# Patient Record
Sex: Female | Born: 1945
Health system: Southern US, Community
[De-identification: ages and names within clinical notes are randomized; demographics above are authoritative.]

## PROBLEM LIST (undated history)

## (undated) DIAGNOSIS — E785 Hyperlipidemia, unspecified: Secondary | ICD-10-CM

## (undated) DIAGNOSIS — C801 Malignant (primary) neoplasm, unspecified: Secondary | ICD-10-CM

## (undated) DIAGNOSIS — J189 Pneumonia, unspecified organism: Secondary | ICD-10-CM

## (undated) DIAGNOSIS — Z87442 Personal history of urinary calculi: Secondary | ICD-10-CM

## (undated) DIAGNOSIS — M1990 Unspecified osteoarthritis, unspecified site: Secondary | ICD-10-CM

## (undated) HISTORY — PX: EYE SURGERY: SHX253

## (undated) HISTORY — PX: TONSILLECTOMY: SUR1361

## (undated) HISTORY — PX: COLONOSCOPY: SHX174

## (undated) HISTORY — PX: BREAST SURGERY: SHX581

---

## 2014-03-22 DIAGNOSIS — D0512 Intraductal carcinoma in situ of left breast: Secondary | ICD-10-CM | POA: Insufficient documentation

## 2015-07-13 DIAGNOSIS — Z79810 Long term (current) use of selective estrogen receptor modulators (SERMs): Secondary | ICD-10-CM

## 2015-07-13 DIAGNOSIS — D0512 Intraductal carcinoma in situ of left breast: Secondary | ICD-10-CM

## 2015-07-13 DIAGNOSIS — Z170 Estrogen receptor positive status [ER+]: Secondary | ICD-10-CM

## 2015-07-13 DIAGNOSIS — M8580 Other specified disorders of bone density and structure, unspecified site: Secondary | ICD-10-CM

## 2015-09-19 DIAGNOSIS — R509 Fever, unspecified: Secondary | ICD-10-CM | POA: Diagnosis not present

## 2015-09-19 DIAGNOSIS — Z20828 Contact with and (suspected) exposure to other viral communicable diseases: Secondary | ICD-10-CM | POA: Diagnosis not present

## 2015-09-19 DIAGNOSIS — R0989 Other specified symptoms and signs involving the circulatory and respiratory systems: Secondary | ICD-10-CM | POA: Diagnosis not present

## 2015-09-19 DIAGNOSIS — Z6830 Body mass index (BMI) 30.0-30.9, adult: Secondary | ICD-10-CM | POA: Diagnosis not present

## 2015-11-08 DIAGNOSIS — Z79811 Long term (current) use of aromatase inhibitors: Secondary | ICD-10-CM | POA: Diagnosis not present

## 2015-11-08 DIAGNOSIS — Z86000 Personal history of in-situ neoplasm of breast: Secondary | ICD-10-CM | POA: Diagnosis not present

## 2015-12-06 DIAGNOSIS — E559 Vitamin D deficiency, unspecified: Secondary | ICD-10-CM | POA: Diagnosis not present

## 2015-12-06 DIAGNOSIS — D0592 Unspecified type of carcinoma in situ of left breast: Secondary | ICD-10-CM | POA: Diagnosis not present

## 2015-12-06 DIAGNOSIS — E785 Hyperlipidemia, unspecified: Secondary | ICD-10-CM | POA: Diagnosis not present

## 2015-12-06 DIAGNOSIS — E669 Obesity, unspecified: Secondary | ICD-10-CM | POA: Diagnosis not present

## 2015-12-06 DIAGNOSIS — Z6830 Body mass index (BMI) 30.0-30.9, adult: Secondary | ICD-10-CM | POA: Diagnosis not present

## 2015-12-06 DIAGNOSIS — M8580 Other specified disorders of bone density and structure, unspecified site: Secondary | ICD-10-CM | POA: Diagnosis not present

## 2015-12-06 DIAGNOSIS — Z23 Encounter for immunization: Secondary | ICD-10-CM | POA: Diagnosis not present

## 2016-02-27 DIAGNOSIS — Z13820 Encounter for screening for osteoporosis: Secondary | ICD-10-CM | POA: Diagnosis not present

## 2016-02-27 DIAGNOSIS — M8589 Other specified disorders of bone density and structure, multiple sites: Secondary | ICD-10-CM | POA: Diagnosis not present

## 2016-03-06 DIAGNOSIS — Z9889 Other specified postprocedural states: Secondary | ICD-10-CM | POA: Diagnosis not present

## 2016-03-06 DIAGNOSIS — D0512 Intraductal carcinoma in situ of left breast: Secondary | ICD-10-CM | POA: Diagnosis not present

## 2016-03-06 DIAGNOSIS — R928 Other abnormal and inconclusive findings on diagnostic imaging of breast: Secondary | ICD-10-CM | POA: Diagnosis not present

## 2016-03-14 DIAGNOSIS — D0512 Intraductal carcinoma in situ of left breast: Secondary | ICD-10-CM | POA: Diagnosis not present

## 2016-03-14 DIAGNOSIS — Z853 Personal history of malignant neoplasm of breast: Secondary | ICD-10-CM | POA: Diagnosis not present

## 2016-06-11 DIAGNOSIS — D0592 Unspecified type of carcinoma in situ of left breast: Secondary | ICD-10-CM | POA: Diagnosis not present

## 2016-06-11 DIAGNOSIS — Z9181 History of falling: Secondary | ICD-10-CM | POA: Diagnosis not present

## 2016-06-11 DIAGNOSIS — Z1389 Encounter for screening for other disorder: Secondary | ICD-10-CM | POA: Diagnosis not present

## 2016-06-11 DIAGNOSIS — Z23 Encounter for immunization: Secondary | ICD-10-CM | POA: Diagnosis not present

## 2016-06-11 DIAGNOSIS — E785 Hyperlipidemia, unspecified: Secondary | ICD-10-CM | POA: Diagnosis not present

## 2016-06-11 DIAGNOSIS — M8589 Other specified disorders of bone density and structure, multiple sites: Secondary | ICD-10-CM | POA: Diagnosis not present

## 2016-06-11 DIAGNOSIS — E559 Vitamin D deficiency, unspecified: Secondary | ICD-10-CM | POA: Diagnosis not present

## 2016-09-12 DIAGNOSIS — Z79810 Long term (current) use of selective estrogen receptor modulators (SERMs): Secondary | ICD-10-CM | POA: Diagnosis not present

## 2016-09-12 DIAGNOSIS — Z86000 Personal history of in-situ neoplasm of breast: Secondary | ICD-10-CM | POA: Diagnosis not present

## 2016-12-16 DIAGNOSIS — M8589 Other specified disorders of bone density and structure, multiple sites: Secondary | ICD-10-CM | POA: Diagnosis not present

## 2016-12-16 DIAGNOSIS — E559 Vitamin D deficiency, unspecified: Secondary | ICD-10-CM | POA: Diagnosis not present

## 2016-12-16 DIAGNOSIS — D0592 Unspecified type of carcinoma in situ of left breast: Secondary | ICD-10-CM | POA: Diagnosis not present

## 2016-12-16 DIAGNOSIS — E785 Hyperlipidemia, unspecified: Secondary | ICD-10-CM | POA: Diagnosis not present

## 2017-01-19 DIAGNOSIS — M25551 Pain in right hip: Secondary | ICD-10-CM | POA: Diagnosis not present

## 2017-01-19 DIAGNOSIS — M25562 Pain in left knee: Secondary | ICD-10-CM | POA: Diagnosis not present

## 2017-03-09 DIAGNOSIS — R928 Other abnormal and inconclusive findings on diagnostic imaging of breast: Secondary | ICD-10-CM | POA: Diagnosis not present

## 2017-03-12 DIAGNOSIS — Z170 Estrogen receptor positive status [ER+]: Secondary | ICD-10-CM | POA: Diagnosis not present

## 2017-03-12 DIAGNOSIS — D0512 Intraductal carcinoma in situ of left breast: Secondary | ICD-10-CM | POA: Diagnosis not present

## 2017-03-12 DIAGNOSIS — Z79810 Long term (current) use of selective estrogen receptor modulators (SERMs): Secondary | ICD-10-CM | POA: Diagnosis not present

## 2017-05-22 DIAGNOSIS — Z23 Encounter for immunization: Secondary | ICD-10-CM | POA: Diagnosis not present

## 2017-06-19 DIAGNOSIS — M8589 Other specified disorders of bone density and structure, multiple sites: Secondary | ICD-10-CM | POA: Diagnosis not present

## 2017-06-19 DIAGNOSIS — D0592 Unspecified type of carcinoma in situ of left breast: Secondary | ICD-10-CM | POA: Diagnosis not present

## 2017-06-19 DIAGNOSIS — E785 Hyperlipidemia, unspecified: Secondary | ICD-10-CM | POA: Diagnosis not present

## 2017-06-19 DIAGNOSIS — E559 Vitamin D deficiency, unspecified: Secondary | ICD-10-CM | POA: Diagnosis not present

## 2017-09-10 DIAGNOSIS — Z86000 Personal history of in-situ neoplasm of breast: Secondary | ICD-10-CM | POA: Diagnosis not present

## 2017-09-10 DIAGNOSIS — Z79810 Long term (current) use of selective estrogen receptor modulators (SERMs): Secondary | ICD-10-CM | POA: Diagnosis not present

## 2017-12-25 DIAGNOSIS — M8589 Other specified disorders of bone density and structure, multiple sites: Secondary | ICD-10-CM | POA: Diagnosis not present

## 2017-12-25 DIAGNOSIS — E559 Vitamin D deficiency, unspecified: Secondary | ICD-10-CM | POA: Diagnosis not present

## 2017-12-25 DIAGNOSIS — E785 Hyperlipidemia, unspecified: Secondary | ICD-10-CM | POA: Diagnosis not present

## 2017-12-25 DIAGNOSIS — D0592 Unspecified type of carcinoma in situ of left breast: Secondary | ICD-10-CM | POA: Diagnosis not present

## 2018-03-10 DIAGNOSIS — Z853 Personal history of malignant neoplasm of breast: Secondary | ICD-10-CM | POA: Diagnosis not present

## 2018-03-10 DIAGNOSIS — R928 Other abnormal and inconclusive findings on diagnostic imaging of breast: Secondary | ICD-10-CM | POA: Diagnosis not present

## 2018-03-12 DIAGNOSIS — D0512 Intraductal carcinoma in situ of left breast: Secondary | ICD-10-CM | POA: Diagnosis not present

## 2018-03-12 DIAGNOSIS — Z170 Estrogen receptor positive status [ER+]: Secondary | ICD-10-CM | POA: Diagnosis not present

## 2018-03-12 DIAGNOSIS — Z79810 Long term (current) use of selective estrogen receptor modulators (SERMs): Secondary | ICD-10-CM | POA: Diagnosis not present

## 2018-03-16 DIAGNOSIS — M1712 Unilateral primary osteoarthritis, left knee: Secondary | ICD-10-CM | POA: Diagnosis not present

## 2018-03-18 DIAGNOSIS — H25811 Combined forms of age-related cataract, right eye: Secondary | ICD-10-CM | POA: Diagnosis not present

## 2018-03-30 DIAGNOSIS — G8929 Other chronic pain: Secondary | ICD-10-CM | POA: Diagnosis not present

## 2018-03-30 DIAGNOSIS — M25562 Pain in left knee: Secondary | ICD-10-CM | POA: Diagnosis not present

## 2018-03-30 DIAGNOSIS — M1712 Unilateral primary osteoarthritis, left knee: Secondary | ICD-10-CM | POA: Diagnosis not present

## 2018-03-31 DIAGNOSIS — H25812 Combined forms of age-related cataract, left eye: Secondary | ICD-10-CM | POA: Diagnosis not present

## 2018-04-27 DIAGNOSIS — Z23 Encounter for immunization: Secondary | ICD-10-CM | POA: Diagnosis not present

## 2018-04-29 DIAGNOSIS — M25562 Pain in left knee: Secondary | ICD-10-CM | POA: Diagnosis not present

## 2018-04-29 DIAGNOSIS — G8929 Other chronic pain: Secondary | ICD-10-CM | POA: Diagnosis not present

## 2018-06-29 DIAGNOSIS — E785 Hyperlipidemia, unspecified: Secondary | ICD-10-CM | POA: Diagnosis not present

## 2018-06-29 DIAGNOSIS — D0592 Unspecified type of carcinoma in situ of left breast: Secondary | ICD-10-CM | POA: Diagnosis not present

## 2018-06-29 DIAGNOSIS — M8589 Other specified disorders of bone density and structure, multiple sites: Secondary | ICD-10-CM | POA: Diagnosis not present

## 2018-06-29 DIAGNOSIS — E559 Vitamin D deficiency, unspecified: Secondary | ICD-10-CM | POA: Diagnosis not present

## 2018-07-20 DIAGNOSIS — G8929 Other chronic pain: Secondary | ICD-10-CM | POA: Diagnosis not present

## 2018-07-20 DIAGNOSIS — M1712 Unilateral primary osteoarthritis, left knee: Secondary | ICD-10-CM | POA: Diagnosis not present

## 2018-09-13 ENCOUNTER — Other Ambulatory Visit: Payer: Self-pay | Admitting: Orthopedic Surgery

## 2018-09-27 NOTE — Patient Instructions (Signed)
Brynnlie Unterreiner Beers  09/27/2018   Your procedure is scheduled on: 10-04-18   Report to [facility] Main  Entrance               Report to  Short stay at       0530 AM    Call this number if you have problems the morning of surgery [phone]    Remember: Do not eat food or drink liquids :After Midnight.  BRUSH YOUR TEETH MORNING OF SURGERY AND RINSE YOUR MOUTH OUT, NO CHEWING GUM CANDY OR MINTS.     Take these medicines the morning of surgery with A SIP OF WATER: none                                You may not have any metal on your body including hair pins and              piercings  Do not wear jewelry, make-up, lotions, powders or perfumes, deodorant             Do not wear nail polish.  Do not shave  48 hours prior to surgery.     Do not bring valuables to the hospital. [place].  Contacts, dentures or bridgework may not be worn into surgery.  Leave suitcase in the car. After surgery it may be brought to your room.                 Please read over the following fact sheets you were given: _____________________________________________________________________            [facility] - Preparing for Surgery Before surgery, you can play an important role.  Because skin is not sterile, your skin needs to be as free of germs as possible.  You can reduce the number of germs on your skin by washing with CHG (chlorahexidine gluconate) soap before surgery.  CHG is an antiseptic cleaner which kills germs and bonds with the skin to continue killing germs even after washing. Please DO NOT use if you have an allergy to CHG or antibacterial soaps.  If your skin becomes reddened/irritated stop using the CHG and inform your nurse when you arrive at Short Stay. Do not shave (including legs and underarms) for at least 48 hours prior to the first CHG shower.  You may shave your face/neck. Please follow these  instructions carefully:  1.  Shower with CHG Soap the night before surgery and the  morning of Surgery.  2.  If you choose to wash your hair, wash your hair first as usual with your  normal  shampoo.  3.  After you shampoo, rinse your hair and body thoroughly to remove the  shampoo.                           4.  Use CHG as you would any other liquid soap.  You can apply chg directly  to the skin and wash                       Gently with a scrungie or clean washcloth.  5.  Apply the CHG Soap to your body  ONLY FROM THE NECK DOWN.   Do not use on face/ open                           Wound or open sores. Avoid contact with eyes, ears mouth and genitals (private parts).                       Wash face,  Genitals (private parts) with your normal soap.             6.  Wash thoroughly, paying special attention to the area where your surgery  will be performed.  7.  Thoroughly rinse your body with warm water from the neck down.  8.  DO NOT shower/wash with your normal soap after using and rinsing off  the CHG Soap.                9.  Pat yourself dry with a clean towel.            10.  Wear clean pajamas.            11.  Place clean sheets on your bed the night of your first shower and do not  sleep with pets. Day of Surgery : Do not apply any lotions/deodorants the morning of surgery.  Please wear clean clothes to the hospital/surgery center.  FAILURE TO FOLLOW THESE INSTRUCTIONS MAY RESULT IN THE CANCELLATION OF YOUR SURGERY PATIENT SIGNATURE_________________________________  NURSE SIGNATURE__________________________________  ________________________________________________________________________   Adam Phenix  An incentive spirometer is a tool that can help keep your lungs clear and active. This tool measures how well you are filling your lungs with each breath. Taking long deep breaths may help reverse or decrease the chance of developing breathing (pulmonary) problems (especially  infection) following:  A long period of time when you are unable to move or be active. BEFORE THE PROCEDURE   If the spirometer includes an indicator to show your best effort, your nurse or respiratory therapist will set it to a desired goal.  If possible, sit up straight or lean slightly forward. Try not to slouch.  Hold the incentive spirometer in an upright position. INSTRUCTIONS FOR USE  1. Sit on the edge of your bed if possible, or sit up as far as you can in bed or on a chair. 2. Hold the incentive spirometer in an upright position. 3. Breathe out normally. 4. Place the mouthpiece in your mouth and seal your lips tightly around it. 5. Breathe in slowly and as deeply as possible, raising the piston or the ball toward the top of the column. 6. Hold your breath for 3-5 seconds or for as long as possible. Allow the piston or ball to fall to the bottom of the column. 7. Remove the mouthpiece from your mouth and breathe out normally. 8. Rest for a few seconds and repeat Steps 1 through 7 at least 10 times every 1-2 hours when you are awake. Take your time and take a few normal breaths between deep breaths. 9. The spirometer may include an indicator to show your best effort. Use the indicator as a goal to work toward during each repetition. 10. After each set of 10 deep breaths, practice coughing to be sure your lungs are clear. If you have an incision (the cut made at the time of surgery), support your incision when coughing by placing a pillow or rolled up towels firmly against it. Once  you are able to get out of bed, walk around indoors and cough well. You may stop using the incentive spirometer when instructed by your caregiver.  RISKS AND COMPLICATIONS  Take your time so you do not get dizzy or light-headed.  If you are in pain, you may need to take or ask for pain medication before doing incentive spirometry. It is harder to take a deep breath if you are having pain. AFTER  USE  Rest and breathe slowly and easily.  It can be helpful to keep track of a log of your progress. Your caregiver can provide you with a simple table to help with this. If you are using the spirometer at home, follow these instructions: [place] IF:   You are having difficultly using the spirometer.  You have trouble using the spirometer as often as instructed.  Your pain medication is not giving enough relief while using the spirometer.  You develop fever of 100.5 F (38.1 C) or higher. SEEK IMMEDIATE MEDICAL CARE IF:   You cough up bloody sputum that had not been present before.  You develop fever of 102 F (38.9 C) or greater.  You develop worsening pain at or near the incision site. MAKE SURE YOU:   Understand these instructions.  Will watch your condition.  Will get help right away if you are not doing well or get worse. Document Released: 12/01/2006 Document Revised: 10/13/2011 Document Reviewed: 02/01/2007 [facility] Patient Information 2014 [place], [place].   ________________________________________________________________________

## 2018-09-28 ENCOUNTER — Encounter (HOSPITAL_COMMUNITY)
Admission: RE | Admit: 2018-09-28 | Discharge: 2018-09-28 | Disposition: A | Discharge status: Home / Self Care | Payer: Medicare Other | Source: Ambulatory Visit | Attending: Orthopedic Surgery | Admitting: Orthopedic Surgery

## 2018-09-28 ENCOUNTER — Other Ambulatory Visit: Payer: Self-pay

## 2018-09-28 ENCOUNTER — Encounter (HOSPITAL_COMMUNITY): Payer: Self-pay

## 2018-09-28 DIAGNOSIS — Z01812 Encounter for preprocedural laboratory examination: Secondary | ICD-10-CM | POA: Insufficient documentation

## 2018-09-28 DIAGNOSIS — M1712 Unilateral primary osteoarthritis, left knee: Secondary | ICD-10-CM | POA: Insufficient documentation

## 2018-09-28 HISTORY — DX: Unspecified osteoarthritis, unspecified site: M19.90

## 2018-09-28 HISTORY — DX: Malignant (primary) neoplasm, unspecified: C80.1

## 2018-09-28 HISTORY — DX: Personal history of urinary calculi: Z87.442

## 2018-09-28 HISTORY — DX: Pneumonia, unspecified organism: J18.9

## 2018-09-28 LAB — COMPREHENSIVE METABOLIC PANEL
ALT: 17 U/L (ref 0–44)
AST: 17 U/L (ref 15–41)
Albumin: 4.4 g/dL (ref 3.5–5.0)
Alkaline Phosphatase: 44 U/L (ref 38–126)
Anion gap: 6 (ref 5–15)
BUN: 26 mg/dL — AB (ref 8–23)
CO2: 28 mmol/L (ref 22–32)
Calcium: 9 mg/dL (ref 8.9–10.3)
Chloride: 106 mmol/L (ref 98–111)
Creatinine, Ser: 0.78 mg/dL (ref 0.44–1.00)
GFR calc Af Amer: >60 mL/min (ref >60)
GFR calc non Af Amer: >60 mL/min (ref >60)
Glucose, Bld: 95 mg/dL (ref 70–99)
Potassium: 4.2 mmol/L (ref 3.5–5.1)
Sodium: 140 mmol/L (ref 135–145)
Total Bilirubin: 0.8 mg/dL (ref 0.3–1.2)
Total Protein: 7.6 g/dL (ref 6.5–8.1)

## 2018-09-28 LAB — CBC WITH DIFFERENTIAL/PLATELET
ABS IMMATURE GRANULOCYTES: 0.03 10*3/uL (ref 0.00–0.07)
Basophils Absolute: 0.1 10*3/uL (ref 0.0–0.1)
Basophils Relative: 1 %
Eosinophils Absolute: 0.2 10*3/uL (ref 0.0–0.5)
Eosinophils Relative: 2 %
HCT: 46.1 % — ABNORMAL HIGH (ref 36.0–46.0)
Hemoglobin: 14.5 g/dL (ref 12.0–15.0)
IMMATURE GRANULOCYTES: 0 %
LYMPHS PCT: 26 %
Lymphs Abs: 2.2 10*3/uL (ref 0.7–4.0)
MCH: 30.2 pg (ref 26.0–34.0)
MCHC: 31.5 g/dL (ref 30.0–36.0)
MCV: 96 fL (ref 80.0–100.0)
Monocytes Absolute: 1 10*3/uL (ref 0.1–1.0)
Monocytes Relative: 12 %
Neutro Abs: 5.1 10*3/uL (ref 1.7–7.7)
Neutrophils Relative %: 59 %
Platelets: 227 10*3/uL (ref 150–400)
RBC: 4.8 MIL/uL (ref 3.87–5.11)
RDW: 13 % (ref 11.5–15.5)
WBC: 8.6 10*3/uL (ref 4.0–10.5)
nRBC: 0 % (ref 0.0–0.2)

## 2018-09-28 LAB — SURGICAL PCR SCREEN
MRSA, PCR: NEGATIVE
Staphylococcus aureus: NEGATIVE

## 2018-09-28 NOTE — Progress Notes (Signed)
GRM:BOBOFPU Tracy Luna  CARDIOLOGIST:none  INFO IN Epic: [id number]  INFO ON CHART: hx left breast Ca stage zero no chemo or radiation , arthritis  BLOOD THINNERS AND LAST DOSES:N/A ____________________________________  PATIENT SYMPTOMS AT TIME OF PREOP: none

## 2018-10-02 NOTE — Anesthesia Preprocedure Evaluation (Addendum)
Anesthesia Evaluation  Patient identified by MRN, date of birth, ID band  Reviewed: Allergy & Precautions, NPO status , Patient's Chart, lab work & pertinent test results  Airway Mallampati: II  TM Distance: >3 FB Neck ROM: Full    Dental no notable dental hx. (+) Teeth Intact   Pulmonary Current Smoker,    Pulmonary exam normal breath sounds clear to auscultation       Cardiovascular Exercise Tolerance: Good negative cardio ROS Normal cardiovascular exam Rhythm:Regular Rate:Normal     Neuro/Psych negative neurological ROS  negative psych ROS   GI/Hepatic negative GI ROS,   Endo/Other    Renal/GU      Musculoskeletal  (+) Arthritis ,   Abdominal (+) + obese,   Peds  Hematology Hgb 14.5 Plt 227   Anesthesia Other Findings Hx of Breast Ca  Reproductive/Obstetrics                           Anesthesia Physical Anesthesia Plan  ASA: II  Anesthesia Plan: Spinal   Post-op Pain Management:  Regional for Post-op pain   Induction:   PONV Risk Score and Plan: Treatment may vary due to age or medical condition, Ondansetron and Dexamethasone  Airway Management Planned: Nasal Cannula and Natural Airway  Additional Equipment:   Intra-op Plan:   Post-operative Plan:   Informed Consent:   Plan Discussed with:   Anesthesia Plan Comments: (&!F fot L TKA Under Spinal w L Adductor canal block)        Anesthesia Quick Evaluation

## 2018-10-03 MED ORDER — BUPIVACAINE LIPOSOME 1.3 % IJ SUSP
20.0000 mL | Freq: Once | INTRAMUSCULAR | Status: DC
  Filled 2018-10-03: qty 20

## 2018-10-04 ENCOUNTER — Ambulatory Visit (HOSPITAL_COMMUNITY): Payer: Medicare Other | Admitting: Physician Assistant

## 2018-10-04 ENCOUNTER — Other Ambulatory Visit: Payer: Self-pay

## 2018-10-04 ENCOUNTER — Encounter (HOSPITAL_COMMUNITY)
Admission: RE | Disposition: A | Discharge status: Home / Self Care | Payer: Self-pay | Source: Home / Self Care | Attending: Orthopedic Surgery

## 2018-10-04 ENCOUNTER — Encounter (HOSPITAL_COMMUNITY): Payer: Self-pay | Admitting: Emergency Medicine

## 2018-10-04 ENCOUNTER — Observation Stay (HOSPITAL_COMMUNITY)
Admission: RE | Admit: 2018-10-04 | Discharge: 2018-10-05 | Disposition: A | Discharge status: Home / Self Care | Payer: Medicare Other | Attending: Orthopedic Surgery | Admitting: Orthopedic Surgery

## 2018-10-04 ENCOUNTER — Ambulatory Visit (HOSPITAL_COMMUNITY): Payer: Medicare Other | Admitting: Certified Registered Nurse Anesthetist

## 2018-10-04 DIAGNOSIS — Z6833 Body mass index (BMI) 33.0-33.9, adult: Secondary | ICD-10-CM | POA: Diagnosis not present

## 2018-10-04 DIAGNOSIS — Z96659 Presence of unspecified artificial knee joint: Secondary | ICD-10-CM

## 2018-10-04 DIAGNOSIS — Z79899 Other long term (current) drug therapy: Secondary | ICD-10-CM | POA: Insufficient documentation

## 2018-10-04 DIAGNOSIS — R262 Difficulty in walking, not elsewhere classified: Secondary | ICD-10-CM | POA: Diagnosis not present

## 2018-10-04 DIAGNOSIS — F17210 Nicotine dependence, cigarettes, uncomplicated: Secondary | ICD-10-CM | POA: Diagnosis not present

## 2018-10-04 DIAGNOSIS — Z853 Personal history of malignant neoplasm of breast: Secondary | ICD-10-CM | POA: Insufficient documentation

## 2018-10-04 DIAGNOSIS — Z791 Long term (current) use of non-steroidal anti-inflammatories (NSAID): Secondary | ICD-10-CM | POA: Diagnosis not present

## 2018-10-04 DIAGNOSIS — E669 Obesity, unspecified: Secondary | ICD-10-CM | POA: Diagnosis not present

## 2018-10-04 DIAGNOSIS — M1712 Unilateral primary osteoarthritis, left knee: Principal | ICD-10-CM | POA: Insufficient documentation

## 2018-10-04 DIAGNOSIS — G8918 Other acute postprocedural pain: Secondary | ICD-10-CM | POA: Diagnosis not present

## 2018-10-04 HISTORY — PX: TOTAL KNEE ARTHROPLASTY: SHX125

## 2018-10-04 SURGERY — ARTHROPLASTY, KNEE, TOTAL
Anesthesia: Spinal | Site: Knee | Laterality: Left

## 2018-10-04 MED ORDER — SODIUM CHLORIDE 0.9 % IR SOLN
Status: DC | PRN
  Administered 2018-10-04: 3000 mL

## 2018-10-04 MED ORDER — FENTANYL CITRATE (PF) 100 MCG/2ML IJ SOLN
INTRAMUSCULAR | Status: AC
  Filled 2018-10-04: qty 2

## 2018-10-04 MED ORDER — SODIUM CHLORIDE (PF) 0.9 % IJ SOLN
INTRAMUSCULAR | Status: AC
  Filled 2018-10-04: qty 20

## 2018-10-04 MED ORDER — STERILE WATER FOR IRRIGATION IR SOLN
Status: DC | PRN
  Administered 2018-10-04: 2000 mL

## 2018-10-04 MED ORDER — PROPOFOL 500 MG/50ML IV EMUL
INTRAVENOUS | Status: DC | PRN
  Administered 2018-10-04: 50 ug/kg/min via INTRAVENOUS

## 2018-10-04 MED ORDER — ZOLPIDEM TARTRATE 5 MG PO TABS: 5.0000 mg | ORAL_TABLET | Freq: Every evening | ORAL | Status: DC | PRN

## 2018-10-04 MED ORDER — CEFAZOLIN SODIUM-DEXTROSE 2-4 GM/100ML-% IV SOLN
2.0000 g | Freq: Four times a day (QID) | INTRAVENOUS | Status: AC
  Administered 2018-10-04 (×2): 2 g via INTRAVENOUS
  Filled 2018-10-04 (×2): qty 100

## 2018-10-04 MED ORDER — PHENOL 1.4 % MT LIQD: 1.0000 | OROMUCOSAL | Status: DC | PRN

## 2018-10-04 MED ORDER — ATORVASTATIN CALCIUM 20 MG PO TABS
20.0000 mg | ORAL_TABLET | Freq: Every day | ORAL | Status: DC
  Administered 2018-10-04: 20 mg via ORAL
  Filled 2018-10-04: qty 1

## 2018-10-04 MED ORDER — ACETAMINOPHEN 500 MG PO TABS
1000.0000 mg | ORAL_TABLET | Freq: Four times a day (QID) | ORAL | Status: AC
  Administered 2018-10-04 – 2018-10-05 (×4): 1000 mg via ORAL
  Filled 2018-10-04 (×4): qty 2

## 2018-10-04 MED ORDER — METHOCARBAMOL 500 MG IVPB - SIMPLE MED
500.0000 mg | Freq: Four times a day (QID) | INTRAVENOUS | Status: DC | PRN
  Administered 2018-10-04: 500 mg via INTRAVENOUS
  Filled 2018-10-04: qty 50

## 2018-10-04 MED ORDER — BISACODYL 5 MG PO TBEC: 5.0000 mg | DELAYED_RELEASE_TABLET | Freq: Every day | ORAL | Status: DC | PRN

## 2018-10-04 MED ORDER — BUPIVACAINE-EPINEPHRINE 0.5% -1:200000 IJ SOLN
INTRAMUSCULAR | Status: DC | PRN
  Administered 2018-10-04: 30 mL

## 2018-10-04 MED ORDER — LACTATED RINGERS IV SOLN
INTRAVENOUS | Status: DC | PRN
  Administered 2018-10-04 (×2): via INTRAVENOUS

## 2018-10-04 MED ORDER — CLONIDINE HCL (ANALGESIA) 100 MCG/ML EP SOLN
EPIDURAL | Status: DC | PRN
  Administered 2018-10-04: 100 ug

## 2018-10-04 MED ORDER — PANTOPRAZOLE SODIUM 40 MG PO TBEC
40.0000 mg | DELAYED_RELEASE_TABLET | Freq: Every day | ORAL | Status: DC
  Administered 2018-10-04 – 2018-10-05 (×2): 40 mg via ORAL
  Filled 2018-10-04 (×2): qty 1

## 2018-10-04 MED ORDER — DEXAMETHASONE SODIUM PHOSPHATE 10 MG/ML IJ SOLN
INTRAMUSCULAR | Status: AC
  Filled 2018-10-04: qty 1

## 2018-10-04 MED ORDER — ONDANSETRON HCL 4 MG/2ML IJ SOLN: 4.0000 mg | Freq: Four times a day (QID) | INTRAMUSCULAR | Status: DC | PRN

## 2018-10-04 MED ORDER — FLEET ENEMA 7-19 GM/118ML RE ENEM: 1.0000 | ENEMA | Freq: Once | RECTAL | Status: DC | PRN

## 2018-10-04 MED ORDER — ASPIRIN EC 325 MG PO TBEC
325.0000 mg | DELAYED_RELEASE_TABLET | Freq: Two times a day (BID) | ORAL | Status: DC
  Administered 2018-10-05: 325 mg via ORAL
  Filled 2018-10-04: qty 1

## 2018-10-04 MED ORDER — FENTANYL CITRATE (PF) 100 MCG/2ML IJ SOLN
INTRAMUSCULAR | Status: DC | PRN
  Administered 2018-10-04: 50 ug via INTRAVENOUS
  Administered 2018-10-04: 25 ug via INTRAVENOUS

## 2018-10-04 MED ORDER — METHOCARBAMOL 500 MG IVPB - SIMPLE MED
INTRAVENOUS | Status: AC
  Filled 2018-10-04: qty 50

## 2018-10-04 MED ORDER — PROPOFOL 10 MG/ML IV BOLUS
INTRAVENOUS | Status: AC
  Filled 2018-10-04: qty 60

## 2018-10-04 MED ORDER — ROPIVACAINE HCL 5 MG/ML IJ SOLN
INTRAMUSCULAR | Status: DC | PRN
  Administered 2018-10-04: 30 mL

## 2018-10-04 MED ORDER — DEXAMETHASONE SODIUM PHOSPHATE 10 MG/ML IJ SOLN
8.0000 mg | Freq: Once | INTRAMUSCULAR | Status: AC
  Administered 2018-10-04: 8 mg via INTRAVENOUS

## 2018-10-04 MED ORDER — BUPIVACAINE IN DEXTROSE 0.75-8.25 % IT SOLN
INTRATHECAL | Status: DC | PRN
  Administered 2018-10-04: 1.8 mL via INTRATHECAL

## 2018-10-04 MED ORDER — SODIUM CHLORIDE 0.9 % IV SOLN
INTRAVENOUS | Status: DC | PRN
  Administered 2018-10-04: 40 ug/min via INTRAVENOUS

## 2018-10-04 MED ORDER — SENNOSIDES-DOCUSATE SODIUM 8.6-50 MG PO TABS: 1.0000 | ORAL_TABLET | Freq: Every evening | ORAL | Status: DC | PRN

## 2018-10-04 MED ORDER — CEFAZOLIN SODIUM-DEXTROSE 2-4 GM/100ML-% IV SOLN
2.0000 g | INTRAVENOUS | Status: AC
  Administered 2018-10-04: 2 g via INTRAVENOUS
  Filled 2018-10-04: qty 100

## 2018-10-04 MED ORDER — BUPIVACAINE LIPOSOME 1.3 % IJ SUSP
INTRAMUSCULAR | Status: DC | PRN
  Administered 2018-10-04: 20 mL

## 2018-10-04 MED ORDER — GLYCOPYRROLATE PF 0.2 MG/ML IJ SOSY
PREFILLED_SYRINGE | INTRAMUSCULAR | Status: AC
  Filled 2018-10-04: qty 1

## 2018-10-04 MED ORDER — MENTHOL 3 MG MT LOZG: 1.0000 | LOZENGE | OROMUCOSAL | Status: DC | PRN

## 2018-10-04 MED ORDER — OXYCODONE HCL 5 MG PO TABS
5.0000 mg | ORAL_TABLET | ORAL | Status: DC | PRN
  Administered 2018-10-04: 10 mg via ORAL
  Administered 2018-10-05: 5 mg via ORAL
  Filled 2018-10-04: qty 1
  Filled 2018-10-04: qty 2

## 2018-10-04 MED ORDER — ALUM & MAG HYDROXIDE-SIMETH 200-200-20 MG/5ML PO SUSP: 30.0000 mL | ORAL | Status: DC | PRN

## 2018-10-04 MED ORDER — ONDANSETRON HCL 4 MG/2ML IJ SOLN
INTRAMUSCULAR | Status: AC
  Filled 2018-10-04: qty 2

## 2018-10-04 MED ORDER — METOCLOPRAMIDE HCL 5 MG PO TABS: 5.0000 mg | ORAL_TABLET | Freq: Three times a day (TID) | ORAL | Status: DC | PRN

## 2018-10-04 MED ORDER — BUPIVACAINE-EPINEPHRINE (PF) 0.5% -1:200000 IJ SOLN
INTRAMUSCULAR | Status: AC
  Filled 2018-10-04: qty 30

## 2018-10-04 MED ORDER — METOCLOPRAMIDE HCL 5 MG/ML IJ SOLN: 5.0000 mg | Freq: Three times a day (TID) | INTRAMUSCULAR | Status: DC | PRN

## 2018-10-04 MED ORDER — DIPHENHYDRAMINE HCL 12.5 MG/5ML PO ELIX: 12.5000 mg | ORAL_SOLUTION | ORAL | Status: DC | PRN

## 2018-10-04 MED ORDER — TRANEXAMIC ACID-NACL 1000-0.7 MG/100ML-% IV SOLN
1000.0000 mg | Freq: Once | INTRAVENOUS | Status: AC
  Administered 2018-10-04: 1000 mg via INTRAVENOUS
  Filled 2018-10-04: qty 100

## 2018-10-04 MED ORDER — PHENYLEPHRINE 40 MCG/ML (10ML) SYRINGE FOR IV PUSH (FOR BLOOD PRESSURE SUPPORT)
PREFILLED_SYRINGE | INTRAVENOUS | Status: AC
  Filled 2018-10-04: qty 10

## 2018-10-04 MED ORDER — DOCUSATE SODIUM 100 MG PO CAPS
100.0000 mg | ORAL_CAPSULE | Freq: Two times a day (BID) | ORAL | Status: DC
  Administered 2018-10-04 – 2018-10-05 (×2): 100 mg via ORAL
  Filled 2018-10-04 (×2): qty 1

## 2018-10-04 MED ORDER — ONDANSETRON HCL 4 MG PO TABS: 4.0000 mg | ORAL_TABLET | Freq: Four times a day (QID) | ORAL | Status: DC | PRN

## 2018-10-04 MED ORDER — HYDROMORPHONE HCL 1 MG/ML IJ SOLN: 0.5000 mg | INTRAMUSCULAR | Status: DC | PRN

## 2018-10-04 MED ORDER — FENTANYL CITRATE (PF) 100 MCG/2ML IJ SOLN: 25.0000 ug | INTRAMUSCULAR | Status: DC | PRN

## 2018-10-04 MED ORDER — SODIUM CHLORIDE 0.9 % IV SOLN
INTRAVENOUS | Status: DC
  Administered 2018-10-04 – 2018-10-05 (×2): via INTRAVENOUS

## 2018-10-04 MED ORDER — DEXAMETHASONE SODIUM PHOSPHATE 10 MG/ML IJ SOLN
10.0000 mg | Freq: Once | INTRAMUSCULAR | Status: AC
  Administered 2018-10-05: 10 mg via INTRAVENOUS
  Filled 2018-10-04: qty 1

## 2018-10-04 MED ORDER — GABAPENTIN 300 MG PO CAPS
300.0000 mg | ORAL_CAPSULE | Freq: Three times a day (TID) | ORAL | Status: DC
  Administered 2018-10-04 – 2018-10-05 (×4): 300 mg via ORAL
  Filled 2018-10-04 (×4): qty 1

## 2018-10-04 MED ORDER — GLYCOPYRROLATE PF 0.2 MG/ML IJ SOSY
PREFILLED_SYRINGE | INTRAMUSCULAR | Status: DC | PRN
  Administered 2018-10-04: .2 mg via INTRAVENOUS

## 2018-10-04 MED ORDER — LACTATED RINGERS IV SOLN: INTRAVENOUS | Status: DC

## 2018-10-04 MED ORDER — PHENYLEPHRINE 40 MCG/ML (10ML) SYRINGE FOR IV PUSH (FOR BLOOD PRESSURE SUPPORT)
PREFILLED_SYRINGE | INTRAVENOUS | Status: DC | PRN
  Administered 2018-10-04: 40 ug via INTRAVENOUS

## 2018-10-04 MED ORDER — SODIUM CHLORIDE 0.9 % IR SOLN
Status: DC | PRN
  Administered 2018-10-04: 1000 mL

## 2018-10-04 MED ORDER — ONDANSETRON HCL 4 MG/2ML IJ SOLN
INTRAMUSCULAR | Status: DC | PRN
  Administered 2018-10-04: 4 mg via INTRAVENOUS

## 2018-10-04 MED ORDER — ACETAMINOPHEN 500 MG PO TABS
1000.0000 mg | ORAL_TABLET | Freq: Once | ORAL | Status: AC
  Administered 2018-10-04: 1000 mg via ORAL
  Filled 2018-10-04: qty 2

## 2018-10-04 MED ORDER — SODIUM CHLORIDE 0.9% FLUSH
INTRAVENOUS | Status: DC | PRN
  Administered 2018-10-04: 20 mL

## 2018-10-04 MED ORDER — TRAMADOL HCL 50 MG PO TABS
50.0000 mg | ORAL_TABLET | Freq: Four times a day (QID) | ORAL | Status: DC
  Administered 2018-10-04 – 2018-10-05 (×5): 50 mg via ORAL
  Filled 2018-10-04 (×5): qty 1

## 2018-10-04 MED ORDER — FERROUS SULFATE 325 (65 FE) MG PO TABS
325.0000 mg | ORAL_TABLET | Freq: Three times a day (TID) | ORAL | Status: DC
  Administered 2018-10-04 – 2018-10-05 (×3): 325 mg via ORAL
  Filled 2018-10-04 (×3): qty 1

## 2018-10-04 MED ORDER — ACETAMINOPHEN 10 MG/ML IV SOLN: 1000.0000 mg | Freq: Once | INTRAVENOUS | Status: DC | PRN

## 2018-10-04 MED ORDER — ONDANSETRON HCL 4 MG/2ML IJ SOLN: 4.0000 mg | Freq: Once | INTRAMUSCULAR | Status: DC | PRN

## 2018-10-04 MED ORDER — TRANEXAMIC ACID-NACL 1000-0.7 MG/100ML-% IV SOLN
1000.0000 mg | INTRAVENOUS | Status: AC
  Administered 2018-10-04: 1000 mg via INTRAVENOUS
  Filled 2018-10-04: qty 100

## 2018-10-04 MED ORDER — METHOCARBAMOL 500 MG PO TABS: 500.0000 mg | ORAL_TABLET | Freq: Four times a day (QID) | ORAL | Status: DC | PRN

## 2018-10-04 MED ORDER — CHLORHEXIDINE GLUCONATE 4 % EX LIQD: 60.0000 mL | Freq: Once | CUTANEOUS | Status: DC

## 2018-10-04 MED ORDER — PHENYLEPHRINE HCL 10 MG/ML IJ SOLN
INTRAMUSCULAR | Status: AC
  Filled 2018-10-04: qty 1

## 2018-10-04 MED ORDER — GABAPENTIN 300 MG PO CAPS
300.0000 mg | ORAL_CAPSULE | Freq: Once | ORAL | Status: AC
  Administered 2018-10-04: 300 mg via ORAL
  Filled 2018-10-04: qty 1

## 2018-10-04 MED ORDER — RALOXIFENE HCL 60 MG PO TABS
60.0000 mg | ORAL_TABLET | Freq: Every day | ORAL | Status: DC
  Administered 2018-10-04: 60 mg via ORAL
  Filled 2018-10-04: qty 1

## 2018-10-04 MED ORDER — MIDAZOLAM HCL 2 MG/2ML IJ SOLN
INTRAMUSCULAR | Status: AC
  Filled 2018-10-04: qty 2

## 2018-10-04 MED ORDER — PROPOFOL 10 MG/ML IV BOLUS
INTRAVENOUS | Status: DC | PRN
  Administered 2018-10-04 (×2): 10 mg via INTRAVENOUS

## 2018-10-04 SURGICAL SUPPLY — 56 items
ARTISURF 10M PLY L 6-9EF KNEE (Knees) ×2 IMPLANT
BAG ZIPLOCK 12X15 (MISCELLANEOUS) ×2 IMPLANT
BANDAGE ACE 6X5 VEL STRL LF (GAUZE/BANDAGES/DRESSINGS) ×2 IMPLANT
BLADE SAGITTAL 13X1.27X60 (BLADE) ×2 IMPLANT
BLADE SAW SGTL 83.5X18.5 (BLADE) ×2 IMPLANT
BLADE SURG 15 STRL LF DISP TIS (BLADE) ×1 IMPLANT
BLADE SURG 15 STRL SS (BLADE) ×1
BLADE SURG SZ10 CARB STEEL (BLADE) ×4 IMPLANT
BOWL SMART MIX CTS (DISPOSABLE) ×2 IMPLANT
CEMENT BONE SIMPLEX SPEEDSET (Cement) ×4 IMPLANT
CHLORAPREP W/TINT 26ML (MISCELLANEOUS) ×4 IMPLANT
COMP FEM PERSONA SZ8 LT (Joint) ×2 IMPLANT
COMPONENT FEM PERSONA SZ8 LT (Joint) ×1 IMPLANT
COVER SURGICAL LIGHT HANDLE (MISCELLANEOUS) ×2 IMPLANT
COVER WAND RF STERILE (DRAPES) IMPLANT
CUFF TOURN SGL QUICK 34 (TOURNIQUET CUFF) ×1
CUFF TRNQT CYL 34X4.125X (TOURNIQUET CUFF) ×1 IMPLANT
DECANTER SPIKE VIAL GLASS SM (MISCELLANEOUS) ×4 IMPLANT
DRAPE INCISE IOBAN 66X45 STRL (DRAPES) ×4 IMPLANT
DRAPE U-SHAPE 47X51 STRL (DRAPES) ×2 IMPLANT
DRSG AQUACEL AG ADV 3.5X10 (GAUZE/BANDAGES/DRESSINGS) ×2 IMPLANT
ELECT REM PT RETURN 15FT ADLT (MISCELLANEOUS) ×2 IMPLANT
GLOVE BIOGEL M STRL SZ7.5 (GLOVE) IMPLANT
GLOVE BIOGEL PI IND STRL 7.5 (GLOVE) ×3 IMPLANT
GLOVE BIOGEL PI IND STRL 8.5 (GLOVE) ×2 IMPLANT
GLOVE BIOGEL PI INDICATOR 7.5 (GLOVE) ×3
GLOVE BIOGEL PI INDICATOR 8.5 (GLOVE) ×2
GLOVE SURG ORTHO 8.0 STRL STRW (GLOVE) ×6 IMPLANT
GOWN SPEC L4 XLG W/TWL (GOWN DISPOSABLE) ×2 IMPLANT
GOWN STRL REUS W/ TWL XL LVL3 (GOWN DISPOSABLE) ×2 IMPLANT
GOWN STRL REUS W/TWL XL LVL3 (GOWN DISPOSABLE) ×2
HANDPIECE INTERPULSE COAX TIP (DISPOSABLE) ×1
HOLDER FOLEY CATH W/STRAP (MISCELLANEOUS) ×2 IMPLANT
HOOD PEEL AWAY FLYTE STAYCOOL (MISCELLANEOUS) ×6 IMPLANT
MANIFOLD NEPTUNE II (INSTRUMENTS) ×2 IMPLANT
NEEDLE HYPO 22GX1.5 SAFETY (NEEDLE) ×2 IMPLANT
NS IRRIG 1000ML POUR BTL (IV SOLUTION) ×2 IMPLANT
PACK TOTAL KNEE CUSTOM (KITS) ×2 IMPLANT
PROTECTOR NERVE ULNAR (MISCELLANEOUS) ×2 IMPLANT
SET HNDPC FAN SPRY TIP SCT (DISPOSABLE) ×1 IMPLANT
STEM POLY PAT PLY 32M KNEE (Knees) ×2 IMPLANT
STEM TIBIA 5 DEG SZ E L KNEE (Knees) ×1 IMPLANT
STRIP CLOSURE SKIN 1/2X4 (GAUZE/BANDAGES/DRESSINGS) ×2 IMPLANT
SUT BONE WAX W31G (SUTURE) ×2 IMPLANT
SUT MNCRL AB 3-0 PS2 18 (SUTURE) ×2 IMPLANT
SUT STRATAFIX 0 PDS 27 VIOLET (SUTURE) ×2
SUT STRATAFIX PDS+ 0 24IN (SUTURE) ×2 IMPLANT
SUT VIC AB 1 CT1 36 (SUTURE) ×2 IMPLANT
SUTURE STRATFX 0 PDS 27 VIOLET (SUTURE) ×1 IMPLANT
SYR CONTROL 10ML LL (SYRINGE) ×4 IMPLANT
TIBIA STEM 5 DEG SZ E L KNEE (Knees) ×2 IMPLANT
TRAY FOLEY CATH 14FRSI W/METER (CATHETERS) ×2 IMPLANT
TRAY FOLEY MTR SLVR 16FR STAT (SET/KITS/TRAYS/PACK) IMPLANT
WATER STERILE IRR 1000ML POUR (IV SOLUTION) ×4 IMPLANT
WRAP KNEE MAXI GEL POST OP (GAUZE/BANDAGES/DRESSINGS) ×2 IMPLANT
YANKAUER SUCT BULB TIP 10FT TU (MISCELLANEOUS) ×2 IMPLANT

## 2018-10-04 NOTE — Transfer of Care (Signed)
Immediate Anesthesia Transfer of Care Note  Patient: Tracy Luna  Procedure(s) Performed: TOTAL KNEE ARTHROPLASTY (Left Knee)  Patient Location: PACU  Anesthesia Type:Spinal  Level of Consciousness: drowsy and patient cooperative  Airway & Oxygen Therapy: Patient Spontanous Breathing and Patient connected to face mask oxygen  Post-op Assessment: Report given to RN and Post -op Vital signs reviewed and stable  Post vital signs: Reviewed and stable  Last Vitals:  Vitals Value Taken Time  BP    Temp    Pulse    Resp    SpO2      Last Pain:  Vitals:   10/04/18 0537  TempSrc: Oral  PainSc: 0-No pain      Patients Stated Pain Goal: 4 (78/97/84 7841)  Complications: No apparent anesthesia complications

## 2018-10-04 NOTE — Op Note (Signed)
TOTAL KNEE REPLACEMENT OPERATIVE NOTE:  10/04/2018  11:12 AM  PATIENT:  Tracy Luna  73 y.o. female  PRE-OPERATIVE DIAGNOSIS:  LT. KNEE OSTEOARTHRITIS  POST-OPERATIVE DIAGNOSIS:  LT. KNEE OSTEOARTHRITIS  PROCEDURE:  Procedure(s): TOTAL KNEE ARTHROPLASTY  SURGEON:  Surgeon(s): Vickey Huger, MD  PHYSICIAN ASSISTANT: Carlyon Shadow, PA-C   ANESTHESIA:   spinal  SPECIMEN: None  COUNTS:  Correct  TOURNIQUET:   Total Tourniquet Time Documented: Thigh (Left) - 34 minutes Total: Thigh (Left) - 34 minutes   DICTATION:  Indication for procedure:    The patient is a 73 y.o. female who has failed conservative treatment for LT. KNEE OSTEOARTHRITIS.  Informed consent was obtained prior to anesthesia. The risks versus benefits of the operation were explain and in a way the patient can, and did, understand.     Description of procedure:     The patient was taken to the operating room and placed under anesthesia.  The patient was positioned in the usual fashion taking care that all body parts were adequately padded and/or protected.  A tourniquet was applied and the leg prepped and draped in the usual sterile fashion.  The extremity was exsanguinated with the esmarch and tourniquet inflated to 300 mmHg.  Pre-operative range of motion was normal.    A midline incision approximately 6-7 inches long was made with a #10 blade.  A new blade was used to make a parapatellar arthrotomy going 2-3 cm into the quadriceps tendon, over the patella, and alongside the medial aspect of the patellar tendon.  A synovectomy was then performed with the #10 blade and forceps. I then elevated the deep MCL off the medial tibial metaphysis subperiosteally around to the semimembranosus attachment.    I everted the patella and used calipers to measure patellar thickness.  I used the reamer to ream down to appropriate thickness to recreate the native thickness.  I then removed excess bone with the  rongeur and sagittal saw.  I used the appropriately sized template and drilled the three lug holes.  I then put the trial in place and measured the thickness with the calipers to ensure recreation of the native thickness.  The trial was then removed and the patella subluxed and the knee brought into flexion.  A homan retractor was place to retract and protect the patella and lateral structures.  A Z-retractor was place medially to protect the medial structures.  The extra-medullary alignment system was used to make cut the tibial articular surface perpendicular to the anamotic axis of the tibia and in 3 degrees of posterior slope.  The cut surface and alignment jig was removed.  I then used the intramedullary alignment guide to make a valgus cut on the distal femur.  I then marked out the epicondylar axis on the distal femur. I then used the anterior referencing sizer and measured the femur to be a size 8.  The 4-In-1 cutting block was screwed into place in external rotation matching the posterior condylar angle, making our cuts perpendicular to the epicondylar axis.  Anterior, posterior and chamfer cuts were made with the sagittal saw.  The cutting block and cut pieces were removed.  A lamina spreader was placed in 90 degrees of flexion.  The ACL, PCL, menisci, and posterior condylar osteophytes were removed.  A 10 mm spacer blocked was found to offer good flexion and extension gap balance after minimal in degree releasing.   The scoop retractor was then placed and the femoral finishing block was  pinned in place.  The small sagittal saw was used as well as the lug drill to finish the femur.  The block and cut surfaces were removed and the medullary canal hole filled with autograft bone from the cut pieces.  The tibia was delivered forward in deep flexion and external rotation.  A size E tray was selected and pinned into place centered on the medial 1/3 of the tibial tubercle.  The reamer and keel was used  to prepare the tibia through the tray.    I then trialed with the size 8 femur, size E tibia, a 10 mm insert and the 32 patella.  I had excellent flexion/extension gap balance, excellent patella tracking.  Flexion was full and beyond 120 degrees; extension was zero.  These components were chosen and the staff opened them to me on the back table while the knee was lavaged copiously and the cement mixed.  The soft tissue was infiltrated with 60cc of exparel 1.3% through a 21 gauge needle.  I cemented in the components and removed all excess cement.  The polyethylene tibial component was snapped into place and the knee placed in extension while cement was hardening.  The capsule was infilltrated with a 60cc exparel/marcaine/saline mixture.   Once the cement was hard, the tourniquet was let down.  Hemostasis was obtained.  The arthrotomy was closed using a #1 stratofix running suture.  The deep soft tissues were closed with #0 vicryls and the subcuticular layer closed with #2-0 vicryl.  The skin was reapproximated and closed with 3.0 Monocryl.  The wound was covered with steristrips, aquacel dressing, and a TED stocking.   The patient was then awakened, extubated, and taken to the recovery room in stable condition.  BLOOD LOSS:  671IW COMPLICATIONS:  None.  PLAN OF CARE: Admit for overnight observation  PATIENT DISPOSITION:  PACU - hemodynamically stable.    Please fax a copy of this op note to my office at [phone] (please only include page 1 and 2 of the Case Information op note)

## 2018-10-04 NOTE — Evaluation (Signed)
Physical Therapy Evaluation Patient Details Name: Tracy Luna MRN: [id number] DOB: 16-Feb-1946 Today's Date: 10/04/2018   History of Present Illness  s/p L TKA  Clinical Impression  Pt is s/p TKA resulting in the deficits listed below (see PT Problem List).  Pt motivated to be OOB, work with PT this pm. Amb ~ 43' with Rw and min/guard. Should continue to progress well  Pt will benefit from skilled PT to increase their independence and safety with mobility to allow discharge to the venue listed below.      Follow Up Recommendations Follow surgeon's recommendation for DC plan and follow-up therapies    Equipment Recommendations  None recommended by PT    Recommendations for Other Services       Precautions / Restrictions Precautions Precautions: Fall;Knee Restrictions Weight Bearing Restrictions: No Other Position/Activity Restrictions: WBAT      Mobility  Bed Mobility Overal bed mobility: Needs Assistance Bed Mobility: Supine to Sit     Supine to sit: Min guard     General bed mobility comments: incr time, cues for technique  Transfers Overall transfer level: Needs assistance Equipment used: Rolling walker (2 wheeled) Transfers: Sit to/from Stand Sit to Stand: Min assist;Min guard         General transfer comment: cues for hadn placement and LLE position  Ambulation/Gait Ambulation/Gait assistance: Min guard;Min assist Gait Distance (Feet): 60 Feet Assistive device: Rolling walker (2 wheeled) Gait Pattern/deviations: Step-to pattern;Step-through pattern;Decreased stride length     General Gait Details: cues for sequence and RW position  Stairs            Wheelchair Mobility    Modified Rankin (Stroke Patients Only)       Balance                                             Pertinent Vitals/Pain Pain Assessment: 0-10 Pain Score: 2  Pain Location: left knee Pain Descriptors / Indicators: Grimacing;Sore Pain  Intervention(s): Limited activity within patient's tolerance;Monitored during session;Premedicated before session;Ice applied    Home Living Family/patient expects to be discharged to:: Private residence Living Arrangements: Children Available Help at Discharge: Family Type of Home: House Home Access: Stairs to enter   Entrance Stairs-Number of Steps: small step Home Layout: Two level;Able to live on main level with bedroom/bathroom Home Equipment: Gilford Rile - 2 wheels      Prior Function Level of Independence: Independent               Hand Dominance        Extremity/Trunk Assessment   Upper Extremity Assessment Upper Extremity Assessment: Overall WFL for tasks assessed    Lower Extremity Assessment Lower Extremity Assessment: LLE deficits/detail LLE Deficits / Details: ankle WFL, knee and hip 2+/5       Communication   Communication: No difficulties  Cognition Arousal/Alertness: Awake/alert Behavior During Therapy: WFL for tasks assessed/performed Overall Cognitive Status: Within Functional Limits for tasks assessed                                        General Comments      Exercises Total Joint Exercises Ankle Circles/Pumps: AROM;Both;15 reps Quad Sets: AROM;5 reps;Both Goniometric ROM: AROM L knee flexion grossly -5* to 75*   Assessment/Plan  PT Assessment Patient needs continued PT services  PT Problem List Decreased strength;Decreased activity tolerance;Decreased knowledge of use of DME;Decreased knowledge of precautions;Decreased mobility;Pain;Decreased range of motion       PT Treatment Interventions DME instruction;Gait training;Functional mobility training;Stair training;Therapeutic activities;Patient/family education;Therapeutic exercise    PT Goals (Current goals can be found in the Care Plan section)  Acute Rehab PT Goals Patient Stated Goal: less pain PT Goal Formulation: With patient Time For Goal Achievement:  10/11/18 Potential to Achieve Goals: Good    Frequency 7X/week   Barriers to discharge        Co-evaluation               AM-PAC PT "6 Clicks" Mobility  Outcome Measure Help needed turning from your back to your side while in a flat bed without using bedrails?: A Little Help needed moving from lying on your back to sitting on the side of a flat bed without using bedrails?: A Little Help needed moving to and from a bed to a chair (including a wheelchair)?: A Little Help needed standing up from a chair using your arms (e.g., wheelchair or bedside chair)?: A Little Help needed to walk in hospital room?: A Little Help needed climbing 3-5 steps with a railing? : A Lot 6 Click Score: 17    End of Session Equipment Utilized During Treatment: Gait belt Activity Tolerance: Patient tolerated treatment well Patient left: in chair;with call bell/phone within reach;with chair alarm set;with family/visitor present   PT Visit Diagnosis: Difficulty in walking, not elsewhere classified (R26.2)    Time: 5809-9833 PT Time Calculation (min) (ACUTE ONLY): 16 min   Charges:   PT Evaluation $PT Eval Low Complexity: 1 Low          Kenyon Ana, PT  Pager: [phone] Acute Rehab Dept [facility]): [phone]   10/04/2018   [facility] 10/04/2018, 5:08 PM

## 2018-10-04 NOTE — Anesthesia Postprocedure Evaluation (Signed)
Anesthesia Post Note  Patient: Tracy Luna  Procedure(s) Performed: TOTAL KNEE ARTHROPLASTY (Left Knee)     Patient location during evaluation: Nursing Unit Anesthesia Type: Spinal Level of consciousness: oriented and awake and alert Pain management: pain level controlled Vital Signs Assessment: post-procedure vital signs reviewed and stable Respiratory status: spontaneous breathing and respiratory function stable Cardiovascular status: blood pressure returned to baseline and stable Postop Assessment: no headache, no backache, no apparent nausea or vomiting and patient able to bend at knees Anesthetic complications: no    Last Vitals:  Vitals:   10/04/18 1030 10/04/18 1136  BP: 117/67 126/85  Pulse: (!) 55 60  Resp: 16   Temp: 36.8 C 36.6 C  SpO2: 97% 100%    Last Pain:  Vitals:   10/04/18 1136  TempSrc: Oral  PainSc:                  [place]

## 2018-10-04 NOTE — H&P (Signed)
Tracy Luna MRN:  [id number] DOB/SEX:  04-03-1946/female  CHIEF COMPLAINT:  Painful left Knee  HISTORY: Patient is a 73 y.o. female presented with a history of pain in the left knee. Onset of symptoms was gradual starting a few months ago with gradually worsening course since that time. Patient has been treated conservatively with over-the-counter NSAIDs and activity modification. Patient currently rates pain in the knee at 10 out of 10 with activity. There is pain at night.  PAST MEDICAL HISTORY: There are no active problems to display for this patient.  Past Medical History:  Diagnosis Date  . Arthritis   . Cancer [facility])    breast cancer left stage zero no radiation or chemo just lumpectomy  . History of kidney stones    x1 1997  . Pneumonia    Walking PNA 30 years ago   Past Surgical History:  Procedure Laterality Date  . BREAST SURGERY     left lumpectomy  . EYE SURGERY     cataracts bil  . TONSILLECTOMY     as kid 73 and 82 years old     MEDICATIONS:   Medications Prior to Admission  Medication Sig Dispense Refill Last Dose  . alendronate (FOSAMAX) 70 MG tablet Take 70 mg by mouth every Wednesday.   09/29/2018  . atorvastatin (LIPITOR) 20 MG tablet Take 20 mg by mouth at bedtime.   10/03/2018 at Unknown time  . CALCIUM-MAGNESIUM-VITAMIN D PO Take 1 tablet by mouth daily.   10/03/2018 at Unknown time  . Cholecalciferol (VITAMIN D) 125 MCG (5000 UT) CAPS Take 5,000 Units by mouth daily.   10/03/2018 at Unknown time  . naproxen sodium (ALEVE) 220 MG tablet Take 220 mg by mouth daily as needed (pain).   09/27/2018  . raloxifene (EVISTA) 60 MG tablet Take 60 mg by mouth at bedtime.   10/03/2018 at Unknown time  . sodium chloride (AYR) 0.65 % nasal spray Place 1 spray into the nose as needed for congestion.   10/04/2018 at Unknown time    ALLERGIES:  No Known Allergies  REVIEW OF SYSTEMS:  A comprehensive review of systems was negative except for: Musculoskeletal: positive  for arthralgias and bone pain   FAMILY HISTORY:  History reviewed. No pertinent family history.  SOCIAL HISTORY:   Social History   Tobacco Use  . Smoking status: Current Some Day Smoker    Years: 45.00    Types: Cigarettes  . Smokeless tobacco: Never Used  . Tobacco comment: never heavy smoker  Substance Use Topics  . Alcohol use: Yes    Comment: occasional wine     EXAMINATION:  Vital signs in last 24 hours: Temp:  [98.4 F (36.9 C)] 98.4 F (36.9 C) (03/02 0537) Pulse Rate:  [67] 67 (03/02 0537) Resp:  [16] 16 (03/02 0537) BP: (159)/(93) 159/93 (03/02 0537) SpO2:  [98 %] 98 % (03/02 0537) Weight:  [88.7 kg] 88.7 kg (03/02 0537)  BP (!) 159/93   Pulse 67   Temp 98.4 F (36.9 C) (Oral)   Resp 16   Ht 5' 4.5" (1.638 m)   Wt 88.7 kg   SpO2 98%   BMI 33.04 kg/m   General Appearance:    Alert, cooperative, no distress, appears stated age  Head:    Normocephalic, without obvious abnormality, atraumatic  Eyes:    PERRL, conjunctiva/corneas clear, EOM's intact, fundi    benign, both eyes  Ears:    Normal TM's and external ear canals, both ears  Nose:   Nares normal, septum midline, mucosa normal, no drainage    or sinus tenderness  Throat:   Lips, mucosa, and tongue normal; teeth and gums normal  Neck:   Supple, symmetrical, trachea midline, no adenopathy;    thyroid:  no enlargement/tenderness/nodules; no carotid   bruit or JVD  Back:     Symmetric, no curvature, ROM normal, no CVA tenderness  Lungs:     Clear to auscultation bilaterally, respirations unlabored  Chest Wall:    No tenderness or deformity   Heart:    Regular rate and rhythm, S1 and S2 normal, no murmur, rub   or gallop  Breast Exam:    No tenderness, masses, or nipple abnormality  Abdomen:     Soft, non-tender, bowel sounds active all four quadrants,    no masses, no organomegaly  Genitalia:    Normal female without lesion, discharge or tenderness  Rectal:    Normal tone, no masses or  tenderness;   guaiac negative stool  Extremities:   Extremities normal, atraumatic, no cyanosis or edema  Pulses:   2+ and symmetric all extremities  Skin:   Skin color, texture, turgor normal, no rashes or lesions  Lymph nodes:   Cervical, supraclavicular, and axillary nodes normal  Neurologic:   CNII-XII intact, normal strength, sensation and reflexes    throughout    Musculoskeletal:  ROM 0-120, Ligaments intact,  Imaging Review Plain radiographs demonstrate severe degenerative joint disease of the left knee. The overall alignment is neutral. The bone quality appears to be good for age and reported activity level.  Assessment/Plan: Primary osteoarthritis, left knee   The patient history, physical examination and imaging studies are consistent with advanced degenerative joint disease of the left knee. The patient has failed conservative treatment.  The clearance notes were reviewed.  After discussion with the patient it was felt that Total Knee Replacement was indicated. The procedure,  risks, and benefits of total knee arthroplasty were presented and reviewed. The risks including but not limited to aseptic loosening, infection, blood clots, vascular injury, stiffness, patella tracking problems complications among others were discussed. The patient acknowledged the explanation, agreed to proceed with the plan.  Preoperative templating of the joint replacement has been completed, documented, and submitted to the Operating Room personnel in order to optimize intra-operative equipment management.    Patient's anticipated LOS is less than 2 midnights, meeting these requirements: - Lives within 1 hour of care - Has a competent adult at home to recover with post-op recover - NO history of  - Chronic pain requiring opiods  - Diabetes  - Coronary Artery Disease  - Heart failure  - Heart attack  - Stroke  - DVT/VTE  - Cardiac arrhythmia  - Respiratory Failure/COPD  - Renal failure  -  Anemia  - Advanced Liver disease       Donia Ast 10/04/2018, 6:18 AM

## 2018-10-04 NOTE — Anesthesia Procedure Notes (Signed)
Anesthesia Regional Block: Adductor canal block   Pre-Anesthetic Checklist: ,, timeout performed, Correct Patient, Correct Site, Correct Laterality, Correct Procedure, Correct Position, site marked, Risks and benefits discussed,  Surgical consent,  Pre-op evaluation,  At surgeon's request and post-op pain management  Laterality: Lower and Left  Prep: chloraprep       Needles:  Injection technique: Single-shot  Needle Type: Echogenic Needle     Needle Length: 9cm  Needle Gauge: 22     Additional Needles:   Procedures:,,,, ultrasound used (permanent image in chart),,,,  Narrative:  Start time: 10/04/2018 6:50 AM End time: 10/04/2018 6:59 AM Injection made incrementally with aspirations every 5 mL.  Performed by: Personally  Anesthesiologist: Barnet Glasgow, MD  Additional Notes: Block assessed prior to surgery. Pt tolerated procedure well.

## 2018-10-04 NOTE — Anesthesia Procedure Notes (Signed)
Spinal  Patient location during procedure: OR Start time: 10/04/2018 7:20 AM End time: 10/04/2018 7:25 AM Staffing Anesthesiologist: Barnet Glasgow, MD Preanesthetic Checklist Completed: patient identified, site marked, surgical consent, pre-op evaluation, timeout performed, IV checked, risks and benefits discussed and monitors and equipment checked Spinal Block Patient position: sitting Prep: DuraPrep Patient monitoring: heart rate, cardiac monitor, continuous pulse ox and blood pressure Approach: midline Location: L3-4 Injection technique: single-shot Needle Needle type: Sprotte  Needle gauge: 24 G Needle length: 9 cm Needle insertion depth: 7 cm Assessment Sensory level: T4 Additional Notes ! Attempt Pt otlerated procedure well

## 2018-10-05 DIAGNOSIS — Z853 Personal history of malignant neoplasm of breast: Secondary | ICD-10-CM | POA: Diagnosis not present

## 2018-10-05 DIAGNOSIS — F17210 Nicotine dependence, cigarettes, uncomplicated: Secondary | ICD-10-CM | POA: Diagnosis not present

## 2018-10-05 DIAGNOSIS — Z6833 Body mass index (BMI) 33.0-33.9, adult: Secondary | ICD-10-CM | POA: Diagnosis not present

## 2018-10-05 DIAGNOSIS — Z791 Long term (current) use of non-steroidal anti-inflammatories (NSAID): Secondary | ICD-10-CM | POA: Diagnosis not present

## 2018-10-05 DIAGNOSIS — M1712 Unilateral primary osteoarthritis, left knee: Secondary | ICD-10-CM | POA: Diagnosis not present

## 2018-10-05 DIAGNOSIS — R262 Difficulty in walking, not elsewhere classified: Secondary | ICD-10-CM | POA: Diagnosis not present

## 2018-10-05 DIAGNOSIS — E669 Obesity, unspecified: Secondary | ICD-10-CM | POA: Diagnosis not present

## 2018-10-05 DIAGNOSIS — Z79899 Other long term (current) drug therapy: Secondary | ICD-10-CM | POA: Diagnosis not present

## 2018-10-05 DIAGNOSIS — Z96652 Presence of left artificial knee joint: Secondary | ICD-10-CM | POA: Diagnosis not present

## 2018-10-05 LAB — BASIC METABOLIC PANEL
Anion gap: 7 (ref 5–15)
BUN: 19 mg/dL (ref 8–23)
CO2: 22 mmol/L (ref 22–32)
Calcium: 7.9 mg/dL — ABNORMAL LOW (ref 8.9–10.3)
Chloride: 104 mmol/L (ref 98–111)
Creatinine, Ser: 0.69 mg/dL (ref 0.44–1.00)
GFR calc Af Amer: >60 mL/min (ref >60)
GFR calc non Af Amer: >60 mL/min (ref >60)
Glucose, Bld: 107 mg/dL — ABNORMAL HIGH (ref 70–99)
Potassium: 4.1 mmol/L (ref 3.5–5.1)
Sodium: 133 mmol/L — ABNORMAL LOW (ref 135–145)

## 2018-10-05 LAB — CBC
HCT: 36 % (ref 36.0–46.0)
Hemoglobin: 11.5 g/dL — ABNORMAL LOW (ref 12.0–15.0)
MCH: 31 pg (ref 26.0–34.0)
MCHC: 31.9 g/dL (ref 30.0–36.0)
MCV: 97 fL (ref 80.0–100.0)
Platelets: 187 10*3/uL (ref 150–400)
RBC: 3.71 MIL/uL — ABNORMAL LOW (ref 3.87–5.11)
RDW: 13.1 % (ref 11.5–15.5)
WBC: 16.6 10*3/uL — ABNORMAL HIGH (ref 4.0–10.5)
nRBC: 0 % (ref 0.0–0.2)

## 2018-10-05 MED ORDER — OXYCODONE HCL 5 MG PO TABS: 5.0000 mg | ORAL_TABLET | Freq: Four times a day (QID) | ORAL | 0 refills | Status: DC | PRN

## 2018-10-05 MED ORDER — ASPIRIN 325 MG PO TBEC: 325.0000 mg | DELAYED_RELEASE_TABLET | Freq: Two times a day (BID) | ORAL | 0 refills | Status: DC

## 2018-10-05 MED ORDER — METHOCARBAMOL 500 MG PO TABS: 500.0000 mg | ORAL_TABLET | Freq: Four times a day (QID) | ORAL | 0 refills | Status: DC | PRN

## 2018-10-05 NOTE — Progress Notes (Signed)
SPORTS MEDICINE AND JOINT REPLACEMENT  Lara Mulch, MD    Carlyon Shadow, PA-C [address], [address], [place]  [address]                             [phone]   PROGRESS NOTE  Subjective:  negative for Chest Pain  negative for Shortness of Breath  negative for Nausea/Vomiting   negative for Calf Pain  negative for Bowel Movement   Tolerating Diet: yes         Patient reports pain as 3 on 0-10 scale.    Objective: Vital signs in last 24 hours:    Patient Vitals for the past 24 hrs:  BP Temp Temp src Pulse Resp SpO2  10/05/18 0532 (!) 144/70 97.9 F (36.6 C) Oral (!) 53 18 94 %  10/05/18 0200 - - - (!) 58 - -  10/05/18 0122 121/62 98.2 F (36.8 C) Oral (!) 47 17 94 %  10/04/18 2108 125/70 98.5 F (36.9 C) Oral (!) 53 17 93 %  10/04/18 1744 123/66 (!) 97.4 F (36.3 C) Oral 62 16 95 %  10/04/18 1342 115/62 (!) 97.3 F (36.3 C) Oral (!) 51 16 98 %  10/04/18 1233 (!) 146/87 (!) 97 F (36.1 C) Axillary 65 16 98 %  10/04/18 1136 126/85 97.9 F (36.6 C) Oral 60 - 100 %  10/04/18 1030 117/67 98.2 F (36.8 C) - (!) 55 16 97 %  10/04/18 1000 113/62 98.2 F (36.8 C) - (!) 56 (!) 24 94 %  10/04/18 0945 122/70 - - 69 17 96 %  10/04/18 0930 (!) 106/53 - - (!) 59 (!) 29 93 %  10/04/18 0915 111/61 - - 65 16 94 %  10/04/18 0900 110/63 - - 73 16 100 %  10/04/18 0849 (!) 106/57 98.2 F (36.8 C) - 73 15 100 %    @flow {1959:LAST@   Intake/Output from previous day:   03/02 0701 - 03/03 0700 In: 4808.2 [P.O.:1070; I.V.:3538.2] Out: 2150 [Urine:2100]   Intake/Output this shift:   No intake/output data recorded.   Intake/Output      03/02 0701 - 03/03 0700 03/03 0701 - 03/04 0700   P.O. 1070    I.V. (mL/kg) 3538.2 (39.9)    Other 0    IV Piggyback 200    Total Intake(mL/kg) 4808.2 (54.2)    Urine (mL/kg/hr) 2100 (1)    Blood 50    Total Output 2150    Net +2658.2            LABORATORY DATA: Recent Labs    09/28/18 0938 10/05/18 0541  WBC 8.6 16.6*  HGB  14.5 11.5*  HCT 46.1* 36.0  PLT 227 187   Recent Labs    09/28/18 0938 10/05/18 0541  NA 140 133*  K 4.2 4.1  CL 106 104  CO2 28 22  BUN 26* 19  CREATININE 0.78 0.69  GLUCOSE 95 107*  CALCIUM 9.0 7.9*   No results found for: INR, PROTIME  Examination:  General appearance: alert, cooperative and no distress Extremities: extremities normal, atraumatic, no cyanosis or edema  Wound Exam: clean, dry, intact   Drainage:  None: wound tissue dry  Motor Exam: Quadriceps and Hamstrings Intact  Sensory Exam: Superficial Peroneal, Deep Peroneal and Tibial normal   Assessment:    1 Day Post-Op  Procedure(s) (LRB): TOTAL KNEE ARTHROPLASTY (Left)  ADDITIONAL DIAGNOSIS:  Active Problems:   S/P total  knee replacement     Plan: Physical Therapy as ordered Weight Bearing as Tolerated (WBAT)  DVT Prophylaxis:  Aspirin  DISCHARGE PLAN: Home  DISCHARGE NEEDS: HHPT     Patient doing well, expected D/C home today  Patient's anticipated LOS is less than 2 midnights, meeting these requirements: - Younger than 60 - Lives within 1 hour of care - Has a competent adult at home to recover with post-op recover - NO history of  - Chronic pain requiring opiods  - Diabetes  - Coronary Artery Disease  - Heart failure  - Heart attack  - Stroke  - DVT/VTE  - Cardiac arrhythmia  - Respiratory Failure/COPD  - Renal failure  - Anemia  - Advanced Liver disease        Donia Ast 10/05/2018, 7:01 AM

## 2018-10-05 NOTE — Discharge Summary (Signed)
SPORTS MEDICINE & JOINT REPLACEMENT   Lara Mulch, MD   Carlyon Shadow, PA-C [address], [place], [place]  [address]                             [phone]  PATIENT ID: Tracy Luna        MRN:  [id number]          DOB/AGE: 1945/10/10 / 73 y.o.    DISCHARGE SUMMARY  ADMISSION DATE:    10/04/2018 DISCHARGE DATE:   10/05/2018   ADMISSION DIAGNOSIS: LT. KNEE OSTEOARTHRITIS    DISCHARGE DIAGNOSIS:  LT. KNEE OSTEOARTHRITIS    ADDITIONAL DIAGNOSIS: Active Problems:   S/P total knee replacement  Past Medical History:  Diagnosis Date  . Arthritis   . Cancer [facility])    breast cancer left stage zero no radiation or chemo just lumpectomy  . History of kidney stones    x1 1997  . Pneumonia    Walking PNA 30 years ago    PROCEDURE: Procedure(s): TOTAL KNEE ARTHROPLASTY on 10/04/2018  CONSULTS:    HISTORY:  See H&P in chart  HOSPITAL COURSE:  Tracy Luna is a 73 y.o. admitted on 10/04/2018 and found to have a diagnosis of LT. KNEE OSTEOARTHRITIS.  After appropriate laboratory studies were obtained  they were taken to the operating room on 10/04/2018 and underwent Procedure(s): TOTAL KNEE ARTHROPLASTY.   They were given perioperative antibiotics:  Anti-infectives (From admission, onward)   Start     Dose/Rate Route Frequency Ordered Stop   10/04/18 1300  ceFAZolin (ANCEF) IVPB 2g/100 mL premix     2 g 200 mL/hr over 30 Minutes Intravenous Every 6 hours 10/04/18 1057 10/04/18 2126   10/04/18 0600  ceFAZolin (ANCEF) IVPB 2g/100 mL premix     2 g 200 mL/hr over 30 Minutes Intravenous On call to O.R. 10/04/18 1062 10/04/18 0719    .  Patient given tranexamic acid IV or topical and exparel intra-operatively.  Tolerated the procedure well.    POD# 1: Vital signs were stable.  Patient denied Chest pain, shortness of breath, or calf pain.  Patient was started on Aspirin twice daily at 8am.  Consults to PT, OT, and care management were made.  The patient  was weight bearing as tolerated.  CPM was placed on the operative leg 0-90 degrees for 6-8 hours a day. When out of the CPM, patient was placed in the foam block to achieve full extension. Incentive spirometry was taught.  Dressing was changed.       POD #2, Continued  PT for ambulation and exercise program.  IV saline locked.  O2 discontinued.    The remainder of the hospital course was dedicated to ambulation and strengthening.   The patient was discharged on 1 Day Post-Op in  Good condition.  Blood products given:none  DIAGNOSTIC STUDIES: Recent vital signs:  Patient Vitals for the past 24 hrs:  BP Temp Temp src Pulse Resp SpO2  10/05/18 0532 (!) 144/70 97.9 F (36.6 C) Oral (!) 53 18 94 %  10/05/18 0200 - - - (!) 58 - -  10/05/18 0122 121/62 98.2 F (36.8 C) Oral (!) 47 17 94 %  10/04/18 2108 125/70 98.5 F (36.9 C) Oral (!) 53 17 93 %  10/04/18 1744 123/66 (!) 97.4 F (36.3 C) Oral 62 16 95 %  10/04/18 1342 115/62 (!) 97.3 F (36.3 C) Oral (!) 51 16 98 %  10/04/18 1233 (!) 146/87 (!) 97 F (36.1 C) Axillary 65 16 98 %  10/04/18 1136 126/85 97.9 F (36.6 C) Oral 60 - 100 %  10/04/18 1030 117/67 98.2 F (36.8 C) - (!) 55 16 97 %  10/04/18 1000 113/62 98.2 F (36.8 C) - (!) 56 (!) 24 94 %  10/04/18 0945 122/70 - - 69 17 96 %  10/04/18 0930 (!) 106/53 - - (!) 59 (!) 29 93 %  10/04/18 0915 111/61 - - 65 16 94 %  10/04/18 0900 110/63 - - 73 16 100 %  10/04/18 0849 (!) 106/57 98.2 F (36.8 C) - 73 15 100 %       Recent laboratory studies: Recent Labs    09/28/18 0938 10/05/18 0541  WBC 8.6 16.6*  HGB 14.5 11.5*  HCT 46.1* 36.0  PLT 227 187   Recent Labs    09/28/18 0938 10/05/18 0541  NA 140 133*  K 4.2 4.1  CL 106 104  CO2 28 22  BUN 26* 19  CREATININE 0.78 0.69  GLUCOSE 95 107*  CALCIUM 9.0 7.9*   No results found for: INR, PROTIME   Recent Radiographic Studies :  No results found.  DISCHARGE INSTRUCTIONS: Discharge Instructions    Call MD / Call  [phone]   Complete by:  As directed    If you experience chest pain or shortness of breath, CALL 911 and be transported to the hospital emergency room.  If you develope a fever above 101 F, pus (white drainage) or increased drainage or redness at the wound, or calf pain, call your surgeon's office.   Constipation Prevention   Complete by:  As directed    Drink plenty of fluids.  Prune juice may be helpful.  You may use a stool softener, such as Colace (over the counter) 100 mg twice a day.  Use MiraLax (over the counter) for constipation as needed.   Diet - low sodium heart healthy   Complete by:  As directed    Discharge instructions   Complete by:  As directed    INSTRUCTIONS AFTER JOINT REPLACEMENT   Remove items at home which could result in a fall. This includes throw rugs or furniture in walking pathways ICE to the affected joint every three hours while awake for 30 minutes at a time, for at least the first 3-5 days, and then as needed for pain and swelling.  Continue to use ice for pain and swelling. You may notice swelling that will progress down to the foot and ankle.  This is normal after surgery.  Elevate your leg when you are not up walking on it.   Continue to use the breathing machine you got in the hospital (incentive spirometer) which will help keep your temperature down.  It is common for your temperature to cycle up and down following surgery, especially at night when you are not up moving around and exerting yourself.  The breathing machine keeps your lungs expanded and your temperature down.   DIET:  As you were doing prior to hospitalization, we recommend a well-balanced diet.  DRESSING / WOUND CARE / SHOWERING  Keep the surgical dressing until follow up.  The dressing is water proof, so you can shower without any extra covering.  IF THE DRESSING FALLS OFF or the wound gets wet inside, change the dressing with sterile gauze.  Please use good hand washing techniques before  changing the dressing.  Do not use any lotions or creams  on the incision until instructed by your surgeon.    ACTIVITY  Increase activity slowly as tolerated, but follow the weight bearing instructions below.   No driving for 6 weeks or until further direction given by your physician.  You cannot drive while taking narcotics.  No lifting or carrying greater than 10 lbs. until further directed by your surgeon. Avoid periods of inactivity such as sitting longer than an hour when not asleep. This helps prevent blood clots.  You may return to work once you are authorized by your doctor.     WEIGHT BEARING   Weight bearing as tolerated with assist device (walker, cane, etc) as directed, use it as long as suggested by your surgeon or therapist, typically at least 4-6 weeks.   EXERCISES  Results after joint replacement surgery are often greatly improved when you follow the exercise, range of motion and muscle strengthening exercises prescribed by your doctor. Safety measures are also important to protect the joint from further injury. Any time any of these exercises cause you to have increased pain or swelling, decrease what you are doing until you are comfortable again and then slowly increase them. If you have problems or questions, call your caregiver or physical therapist for advice.   Rehabilitation is important following a joint replacement. After just a few days of immobilization, the muscles of the leg can become weakened and shrink (atrophy).  These exercises are designed to build up the tone and strength of the thigh and leg muscles and to improve motion. Often times heat used for twenty to thirty minutes before working out will loosen up your tissues and help with improving the range of motion but do not use heat for the first two weeks following surgery (sometimes heat can increase post-operative swelling).   These exercises can be done on a training (exercise) mat, on the floor, on a  table or on a bed. Use whatever works the best and is most comfortable for you.    Use music or television while you are exercising so that the exercises are a pleasant break in your day. This will make your life better with the exercises acting as a break in your routine that you can look forward to.   Perform all exercises about fifteen times, three times per day or as directed.  You should exercise both the operative leg and the other leg as well.   Exercises include:   Quad Sets - Tighten up the muscle on the front of the thigh (Quad) and hold for 5-10 seconds.   Straight Leg Raises - With your knee straight (if you were given a brace, keep it on), lift the leg to 60 degrees, hold for 3 seconds, and slowly lower the leg.  Perform this exercise against resistance later as your leg gets stronger.  Leg Slides: Lying on your back, slowly slide your foot toward your buttocks, bending your knee up off the floor (only go as far as is comfortable). Then slowly slide your foot back down until your leg is flat on the floor again.  Angel Wings: Lying on your back spread your legs to the side as far apart as you can without causing discomfort.  Hamstring Strength:  Lying on your back, push your heel against the floor with your leg straight by tightening up the muscles of your buttocks.  Repeat, but this time bend your knee to a comfortable angle, and push your heel against the floor.  You may put a  pillow under the heel to make it more comfortable if necessary.   A rehabilitation program following joint replacement surgery can speed recovery and prevent re-injury in the future due to weakened muscles. Contact your doctor or a physical therapist for more information on knee rehabilitation.    CONSTIPATION  Constipation is defined medically as fewer than three stools per week and severe constipation as less than one stool per week.  Even if you have a regular bowel pattern at home, your normal regimen is likely  to be disrupted due to multiple reasons following surgery.  Combination of anesthesia, postoperative narcotics, change in appetite and fluid intake all can affect your bowels.   YOU MUST use at least one of the following options; they are listed in order of increasing strength to get the job done.  They are all available over the counter, and you may need to use some, POSSIBLY even all of these options:    Drink plenty of fluids (prune juice may be helpful) and high fiber foods Colace 100 mg by mouth twice a day  Senokot for constipation as directed and as needed Dulcolax (bisacodyl), take with full glass of water  Miralax (polyethylene glycol) once or twice a day as needed.  If you have tried all these things and are unable to have a bowel movement in the first 3-4 days after surgery call either your surgeon or your primary doctor.    If you experience loose stools or diarrhea, hold the medications until you stool forms back up.  If your symptoms do not get better within 1 week or if they get worse, check with your doctor.  If you experience "the worst abdominal pain ever" or develop nausea or vomiting, please contact the office immediately for further recommendations for treatment.   ITCHING:  If you experience itching with your medications, try taking only a single pain pill, or even half a pain pill at a time.  You can also use Benadryl over the counter for itching or also to help with sleep.   TED HOSE STOCKINGS:  Use stockings on both legs until for at least 2 weeks or as directed by physician office. They may be removed at night for sleeping.  MEDICATIONS:  See your medication summary on the "After Visit Summary" that nursing will review with you.  You may have some home medications which will be placed on hold until you complete the course of blood thinner medication.  It is important for you to complete the blood thinner medication as prescribed.  PRECAUTIONS:  If you experience chest  pain or shortness of breath - call 911 immediately for transfer to the hospital emergency department.   If you develop a fever greater that 101 F, purulent drainage from wound, increased redness or drainage from wound, foul odor from the wound/dressing, or calf pain - CONTACT YOUR SURGEON.                                                   FOLLOW-UP APPOINTMENTS:  If you do not already have a post-op appointment, please call the office for an appointment to be seen by your surgeon.  Guidelines for how soon to be seen are listed in your "After Visit Summary", but are typically between 1-4 weeks after surgery.  OTHER INSTRUCTIONS:   Knee Replacement:  Do  not place pillow under knee, focus on keeping the knee straight while resting. CPM instructions: 0-90 degrees, 2 hours in the morning, 2 hours in the afternoon, and 2 hours in the evening. Place foam block, curve side up under heel at all times except when in CPM or when walking.  DO NOT modify, tear, cut, or change the foam block in any way.  MAKE SURE YOU:  Understand these instructions.  Get help right away if you are not doing well or get worse.    Thank you for letting us be a part of your medical care team.  It is a privilege we respect greatly.  We hope these instructions will help you stay on track for a fast and full recovery!   Increase activity slowly as tolerated   Complete by:  As directed       DISCHARGE MEDICATIONS:   Allergies as of 10/05/2018   No Known Allergies     Medication List    STOP taking these medications   naproxen sodium 220 MG tablet Commonly known as:  ALEVE     TAKE these medications   alendronate 70 MG tablet Commonly known as:  FOSAMAX Take 70 mg by mouth every Wednesday.   aspirin 325 MG EC tablet Take 1 tablet (325 mg total) by mouth 2 (two) times daily.   atorvastatin 20 MG tablet Commonly known as:  LIPITOR Take 20 mg by mouth at bedtime.   AYR 0.65 % nasal spray Generic drug:  sodium  chloride Place 1 spray into the nose as needed for congestion.   CALCIUM-MAGNESIUM-VITAMIN D PO Take 1 tablet by mouth daily.   methocarbamol 500 MG tablet Commonly known as:  ROBAXIN Take 1-2 tablets (500-1,000 mg total) by mouth every 6 (six) hours as needed for muscle spasms.   oxyCODONE 5 MG immediate release tablet Commonly known as:  Oxy IR/ROXICODONE Take 1-2 tablets (5-10 mg total) by mouth every 6 (six) hours as needed for moderate pain (pain score 4-6).   raloxifene 60 MG tablet Commonly known as:  EVISTA Take 60 mg by mouth at bedtime.   Vitamin D 125 MCG (5000 UT) Caps Take 5,000 Units by mouth daily.            Durable Medical Equipment  (From admission, onward)         Start     Ordered   10/04/18 1058  DME Walker rolling  Once    Question:  Patient needs a walker to treat with the following condition  Answer:  S/P total knee replacement   10/04/18 1057   10/04/18 1058  DME 3 n 1  Once     10/04/18 1057   10/04/18 1058  DME Bedside commode  Once    Question:  Patient needs a bedside commode to treat with the following condition  Answer:  S/P total knee replacement   10/04/18 1057          FOLLOW UP VISIT:    DISPOSITION: HOME VS. SNF  CONDITION:  Good   Donia Ast 10/05/2018, 7:04 AM

## 2018-10-05 NOTE — Care Management Note (Signed)
Case Management Note  Patient Details  Name: Shaelin Lalley MRN: [id number] Date of Birth: 04/02/1946  Subjective/Objective:                  discharge  Action/Plan: dme- 3 in1 and rolling wlaker through [facility] Expected Discharge Date:  10/05/18               Expected Discharge Plan:  [place]  In-House Referral:     Discharge planning Services  CM Consult  Post Acute Care Choice:  Durable Medical Equipment, Home Health Choice offered to:  Patient  DME Arranged:  3-N-1, Walker rolling DME Agency:  [employer]  HH Arranged:  PT [place] Agency:  [facility] (formerly [employer])  Status of Service:  Completed, signed off  If discussed at [employer], dates discussed:    Additional Comments:  Leeroy Cha, RN 10/05/2018, 10:21 AM

## 2018-10-05 NOTE — Care Management Obs Status (Signed)
[facility] NOTIFICATION   Patient Details  Name: Kynzee Devinney MRN: [id number] Date of Birth: 29-Oct-1945   Medicare Observation Status Notification Given:  Yes    Leeroy Cha, RN 10/05/2018, 11:17 AM

## 2018-10-05 NOTE — Progress Notes (Signed)
Physical Therapy Treatment Patient Details Name: Tracy Luna MRN: [id number] DOB: 1945/08/17 Today's Date: 10/05/2018    History of Present Illness s/p L TKA    PT Comments    Pt has met PT goals and is ready to DC home from PT standpoint. She demonstrates good understanding of HEP, stair training completed, she ambulated 180' with RW.   Follow Up Recommendations  Follow surgeon's recommendation for DC plan and follow-up therapies     Equipment Recommendations  None recommended by PT    Recommendations for Other Services       Precautions / Restrictions Precautions Precautions: Fall;Knee Precaution Booklet Issued: Yes (comment) Precaution Comments: reviewed no pillow under knee Restrictions Weight Bearing Restrictions: No Other Position/Activity Restrictions: WBAT    Mobility  Bed Mobility Overal bed mobility: Needs Assistance Bed Mobility: Supine to Sit     Supine to sit: Modified independent (Device/Increase time);HOB elevated        Transfers Overall transfer level: Needs assistance Equipment used: Rolling walker (2 wheeled) Transfers: Sit to/from Stand Sit to Stand: Supervision         General transfer comment: cues for hand placement and LLE position  Ambulation/Gait Ambulation/Gait assistance: Supervision Gait Distance (Feet): 180 Feet Assistive device: Rolling walker (2 wheeled) Gait Pattern/deviations: Step-to pattern;Step-through pattern;Decreased stride length Gait velocity: decr but WFL   General Gait Details: steady, good sequencing, no loss of balance   Stairs Stairs: Yes   Stair Management: No rails;Step to pattern;With walker;Backwards Number of Stairs: 1 General stair comments: daughter present and steadied RW, VCs sequencing   Wheelchair Mobility    Modified Rankin (Stroke Patients Only)       Balance Overall balance assessment: Modified Independent                                           Cognition Arousal/Alertness: Awake/alert Behavior During Therapy: WFL for tasks assessed/performed Overall Cognitive Status: Within Functional Limits for tasks assessed                                        Exercises Total Joint Exercises Ankle Circles/Pumps: AROM;Both;15 reps Quad Sets: AROM;5 reps;Both Short Arc Quad: AROM;Left;10 reps;Supine Heel Slides: AAROM;Left;10 reps;Supine Hip ABduction/ADduction: AROM;Left;10 reps;Supine Straight Leg Raises: AROM;Left;10 reps;AAROM;Supine Long Arc Quad: AROM;Seated;Left;10 reps Knee Flexion: AAROM;Seated;Left;10 reps Goniometric ROM: 5-55* L knee AAROM, pt reports limited flexion PTA    General Comments        Pertinent Vitals/Pain Pain Score: 4  Pain Location: left knee Pain Descriptors / Indicators: Grimacing;Sore Pain Intervention(s): Limited activity within patient's tolerance;Monitored during session;Premedicated before session;Ice applied    Home Living                      Prior Function            PT Goals (current goals can now be found in the care plan section) Acute Rehab PT Goals Patient Stated Goal: travel PT Goal Formulation: With patient/family Time For Goal Achievement: 10/11/18 Potential to Achieve Goals: Good Progress towards PT goals: Progressing toward goals    Frequency    7X/week      PT Plan Current plan remains appropriate    Co-evaluation  AM-PAC PT "6 Clicks" Mobility   Outcome Measure  Help needed turning from your back to your side while in a flat bed without using bedrails?: None Help needed moving from lying on your back to sitting on the side of a flat bed without using bedrails?: None Help needed moving to and from a bed to a chair (including a wheelchair)?: None Help needed standing up from a chair using your arms (e.g., wheelchair or bedside chair)?: None Help needed to walk in hospital room?: None Help needed climbing 3-5 steps  with a railing? : A Little 6 Click Score: 23    End of Session Equipment Utilized During Treatment: Gait belt Activity Tolerance: Patient tolerated treatment well Patient left: with call bell/phone within reach;with family/visitor present;in bed Nurse Communication: Mobility status PT Visit Diagnosis: Difficulty in walking, not elsewhere classified (R26.2)     Time: 0923-3007 PT Time Calculation (min) (ACUTE ONLY): 34 min  Charges:  $Gait Training: 8-22 mins $Therapeutic Exercise: 8-22 mins                     Blondell Reveal Kistler PT 10/05/2018  Acute Rehabilitation Services Pager [phone] Office [phone]

## 2018-10-06 ENCOUNTER — Encounter (HOSPITAL_COMMUNITY): Payer: Self-pay | Admitting: Orthopedic Surgery

## 2018-10-06 DIAGNOSIS — Z96652 Presence of left artificial knee joint: Secondary | ICD-10-CM | POA: Diagnosis not present

## 2018-10-06 DIAGNOSIS — F17210 Nicotine dependence, cigarettes, uncomplicated: Secondary | ICD-10-CM | POA: Diagnosis not present

## 2018-10-06 DIAGNOSIS — Z471 Aftercare following joint replacement surgery: Secondary | ICD-10-CM | POA: Diagnosis not present

## 2018-10-06 DIAGNOSIS — Z853 Personal history of malignant neoplasm of breast: Secondary | ICD-10-CM | POA: Diagnosis not present

## 2018-10-14 DIAGNOSIS — Z471 Aftercare following joint replacement surgery: Secondary | ICD-10-CM | POA: Diagnosis not present

## 2018-10-14 DIAGNOSIS — M25562 Pain in left knee: Secondary | ICD-10-CM | POA: Diagnosis not present

## 2018-10-14 DIAGNOSIS — Z96652 Presence of left artificial knee joint: Secondary | ICD-10-CM | POA: Diagnosis not present

## 2018-10-15 DIAGNOSIS — Z96652 Presence of left artificial knee joint: Secondary | ICD-10-CM | POA: Diagnosis not present

## 2018-10-15 DIAGNOSIS — M25462 Effusion, left knee: Secondary | ICD-10-CM | POA: Diagnosis not present

## 2018-10-15 DIAGNOSIS — M25562 Pain in left knee: Secondary | ICD-10-CM | POA: Diagnosis not present

## 2018-10-15 DIAGNOSIS — R2689 Other abnormalities of gait and mobility: Secondary | ICD-10-CM | POA: Diagnosis not present

## 2018-10-18 DIAGNOSIS — Z96652 Presence of left artificial knee joint: Secondary | ICD-10-CM | POA: Diagnosis not present

## 2018-10-18 DIAGNOSIS — M25462 Effusion, left knee: Secondary | ICD-10-CM | POA: Diagnosis not present

## 2018-10-18 DIAGNOSIS — R2689 Other abnormalities of gait and mobility: Secondary | ICD-10-CM | POA: Diagnosis not present

## 2018-10-18 DIAGNOSIS — M25562 Pain in left knee: Secondary | ICD-10-CM | POA: Diagnosis not present

## 2018-10-20 DIAGNOSIS — R2689 Other abnormalities of gait and mobility: Secondary | ICD-10-CM | POA: Diagnosis not present

## 2018-10-20 DIAGNOSIS — Z96652 Presence of left artificial knee joint: Secondary | ICD-10-CM | POA: Diagnosis not present

## 2018-10-20 DIAGNOSIS — M25562 Pain in left knee: Secondary | ICD-10-CM | POA: Diagnosis not present

## 2018-10-20 DIAGNOSIS — M25462 Effusion, left knee: Secondary | ICD-10-CM | POA: Diagnosis not present

## 2018-10-22 DIAGNOSIS — M25462 Effusion, left knee: Secondary | ICD-10-CM | POA: Diagnosis not present

## 2018-10-22 DIAGNOSIS — M25562 Pain in left knee: Secondary | ICD-10-CM | POA: Diagnosis not present

## 2018-10-22 DIAGNOSIS — Z96652 Presence of left artificial knee joint: Secondary | ICD-10-CM | POA: Diagnosis not present

## 2018-10-22 DIAGNOSIS — R2689 Other abnormalities of gait and mobility: Secondary | ICD-10-CM | POA: Diagnosis not present

## 2018-10-25 DIAGNOSIS — M25562 Pain in left knee: Secondary | ICD-10-CM | POA: Diagnosis not present

## 2018-10-25 DIAGNOSIS — R2689 Other abnormalities of gait and mobility: Secondary | ICD-10-CM | POA: Diagnosis not present

## 2018-10-25 DIAGNOSIS — M25462 Effusion, left knee: Secondary | ICD-10-CM | POA: Diagnosis not present

## 2018-10-25 DIAGNOSIS — Z96652 Presence of left artificial knee joint: Secondary | ICD-10-CM | POA: Diagnosis not present

## 2018-10-27 DIAGNOSIS — M25462 Effusion, left knee: Secondary | ICD-10-CM | POA: Diagnosis not present

## 2018-10-27 DIAGNOSIS — R2689 Other abnormalities of gait and mobility: Secondary | ICD-10-CM | POA: Diagnosis not present

## 2018-10-27 DIAGNOSIS — Z96652 Presence of left artificial knee joint: Secondary | ICD-10-CM | POA: Diagnosis not present

## 2018-10-27 DIAGNOSIS — M25562 Pain in left knee: Secondary | ICD-10-CM | POA: Diagnosis not present

## 2018-10-29 DIAGNOSIS — M25562 Pain in left knee: Secondary | ICD-10-CM | POA: Diagnosis not present

## 2018-10-29 DIAGNOSIS — M25462 Effusion, left knee: Secondary | ICD-10-CM | POA: Diagnosis not present

## 2018-10-29 DIAGNOSIS — R2689 Other abnormalities of gait and mobility: Secondary | ICD-10-CM | POA: Diagnosis not present

## 2018-10-29 DIAGNOSIS — Z96652 Presence of left artificial knee joint: Secondary | ICD-10-CM | POA: Diagnosis not present

## 2018-11-01 DIAGNOSIS — M25562 Pain in left knee: Secondary | ICD-10-CM | POA: Diagnosis not present

## 2018-11-01 DIAGNOSIS — M25462 Effusion, left knee: Secondary | ICD-10-CM | POA: Diagnosis not present

## 2018-11-01 DIAGNOSIS — R2689 Other abnormalities of gait and mobility: Secondary | ICD-10-CM | POA: Diagnosis not present

## 2018-11-01 DIAGNOSIS — Z96652 Presence of left artificial knee joint: Secondary | ICD-10-CM | POA: Diagnosis not present

## 2018-11-03 DIAGNOSIS — Z96652 Presence of left artificial knee joint: Secondary | ICD-10-CM | POA: Diagnosis not present

## 2018-11-03 DIAGNOSIS — M25462 Effusion, left knee: Secondary | ICD-10-CM | POA: Diagnosis not present

## 2018-11-03 DIAGNOSIS — M25562 Pain in left knee: Secondary | ICD-10-CM | POA: Diagnosis not present

## 2018-11-03 DIAGNOSIS — R2689 Other abnormalities of gait and mobility: Secondary | ICD-10-CM | POA: Diagnosis not present

## 2018-11-05 DIAGNOSIS — R2689 Other abnormalities of gait and mobility: Secondary | ICD-10-CM | POA: Diagnosis not present

## 2018-11-05 DIAGNOSIS — M25462 Effusion, left knee: Secondary | ICD-10-CM | POA: Diagnosis not present

## 2018-11-05 DIAGNOSIS — M25562 Pain in left knee: Secondary | ICD-10-CM | POA: Diagnosis not present

## 2018-11-05 DIAGNOSIS — Z96652 Presence of left artificial knee joint: Secondary | ICD-10-CM | POA: Diagnosis not present

## 2018-11-08 DIAGNOSIS — Z96652 Presence of left artificial knee joint: Secondary | ICD-10-CM | POA: Diagnosis not present

## 2018-11-08 DIAGNOSIS — M25462 Effusion, left knee: Secondary | ICD-10-CM | POA: Diagnosis not present

## 2018-11-08 DIAGNOSIS — R2689 Other abnormalities of gait and mobility: Secondary | ICD-10-CM | POA: Diagnosis not present

## 2018-11-08 DIAGNOSIS — M25562 Pain in left knee: Secondary | ICD-10-CM | POA: Diagnosis not present

## 2018-11-10 DIAGNOSIS — R2689 Other abnormalities of gait and mobility: Secondary | ICD-10-CM | POA: Diagnosis not present

## 2018-11-10 DIAGNOSIS — Z96652 Presence of left artificial knee joint: Secondary | ICD-10-CM | POA: Diagnosis not present

## 2018-11-10 DIAGNOSIS — M25462 Effusion, left knee: Secondary | ICD-10-CM | POA: Diagnosis not present

## 2018-11-10 DIAGNOSIS — M25562 Pain in left knee: Secondary | ICD-10-CM | POA: Diagnosis not present

## 2018-11-16 DIAGNOSIS — M25462 Effusion, left knee: Secondary | ICD-10-CM | POA: Diagnosis not present

## 2018-11-16 DIAGNOSIS — M25562 Pain in left knee: Secondary | ICD-10-CM | POA: Diagnosis not present

## 2018-11-16 DIAGNOSIS — Z96652 Presence of left artificial knee joint: Secondary | ICD-10-CM | POA: Diagnosis not present

## 2018-11-16 DIAGNOSIS — R2689 Other abnormalities of gait and mobility: Secondary | ICD-10-CM | POA: Diagnosis not present

## 2018-11-18 DIAGNOSIS — M25462 Effusion, left knee: Secondary | ICD-10-CM | POA: Diagnosis not present

## 2018-11-18 DIAGNOSIS — R2689 Other abnormalities of gait and mobility: Secondary | ICD-10-CM | POA: Diagnosis not present

## 2018-11-18 DIAGNOSIS — M25562 Pain in left knee: Secondary | ICD-10-CM | POA: Diagnosis not present

## 2018-11-18 DIAGNOSIS — Z96652 Presence of left artificial knee joint: Secondary | ICD-10-CM | POA: Diagnosis not present

## 2018-11-22 DIAGNOSIS — Z96652 Presence of left artificial knee joint: Secondary | ICD-10-CM | POA: Diagnosis not present

## 2018-11-22 DIAGNOSIS — M25562 Pain in left knee: Secondary | ICD-10-CM | POA: Diagnosis not present

## 2018-11-22 DIAGNOSIS — R2689 Other abnormalities of gait and mobility: Secondary | ICD-10-CM | POA: Diagnosis not present

## 2018-11-22 DIAGNOSIS — M25462 Effusion, left knee: Secondary | ICD-10-CM | POA: Diagnosis not present

## 2018-11-25 DIAGNOSIS — M25562 Pain in left knee: Secondary | ICD-10-CM | POA: Diagnosis not present

## 2018-11-25 DIAGNOSIS — R2689 Other abnormalities of gait and mobility: Secondary | ICD-10-CM | POA: Diagnosis not present

## 2018-11-25 DIAGNOSIS — M25462 Effusion, left knee: Secondary | ICD-10-CM | POA: Diagnosis not present

## 2018-11-25 DIAGNOSIS — Z96652 Presence of left artificial knee joint: Secondary | ICD-10-CM | POA: Diagnosis not present

## 2018-11-29 DIAGNOSIS — M25462 Effusion, left knee: Secondary | ICD-10-CM | POA: Diagnosis not present

## 2018-11-29 DIAGNOSIS — R2689 Other abnormalities of gait and mobility: Secondary | ICD-10-CM | POA: Diagnosis not present

## 2018-11-29 DIAGNOSIS — Z96652 Presence of left artificial knee joint: Secondary | ICD-10-CM | POA: Diagnosis not present

## 2018-11-29 DIAGNOSIS — M25562 Pain in left knee: Secondary | ICD-10-CM | POA: Diagnosis not present

## 2018-12-02 DIAGNOSIS — M25562 Pain in left knee: Secondary | ICD-10-CM | POA: Diagnosis not present

## 2018-12-02 DIAGNOSIS — R2689 Other abnormalities of gait and mobility: Secondary | ICD-10-CM | POA: Diagnosis not present

## 2018-12-02 DIAGNOSIS — M25462 Effusion, left knee: Secondary | ICD-10-CM | POA: Diagnosis not present

## 2018-12-02 DIAGNOSIS — Z96652 Presence of left artificial knee joint: Secondary | ICD-10-CM | POA: Diagnosis not present

## 2018-12-06 DIAGNOSIS — R2689 Other abnormalities of gait and mobility: Secondary | ICD-10-CM | POA: Diagnosis not present

## 2018-12-06 DIAGNOSIS — Z96652 Presence of left artificial knee joint: Secondary | ICD-10-CM | POA: Diagnosis not present

## 2018-12-06 DIAGNOSIS — M25562 Pain in left knee: Secondary | ICD-10-CM | POA: Diagnosis not present

## 2018-12-06 DIAGNOSIS — M25462 Effusion, left knee: Secondary | ICD-10-CM | POA: Diagnosis not present

## 2018-12-09 DIAGNOSIS — M25562 Pain in left knee: Secondary | ICD-10-CM | POA: Diagnosis not present

## 2018-12-09 DIAGNOSIS — M25462 Effusion, left knee: Secondary | ICD-10-CM | POA: Diagnosis not present

## 2018-12-09 DIAGNOSIS — Z96652 Presence of left artificial knee joint: Secondary | ICD-10-CM | POA: Diagnosis not present

## 2018-12-09 DIAGNOSIS — R2689 Other abnormalities of gait and mobility: Secondary | ICD-10-CM | POA: Diagnosis not present

## 2018-12-30 DIAGNOSIS — E559 Vitamin D deficiency, unspecified: Secondary | ICD-10-CM | POA: Diagnosis not present

## 2018-12-30 DIAGNOSIS — E785 Hyperlipidemia, unspecified: Secondary | ICD-10-CM | POA: Diagnosis not present

## 2018-12-30 DIAGNOSIS — D0592 Unspecified type of carcinoma in situ of left breast: Secondary | ICD-10-CM | POA: Diagnosis not present

## 2018-12-30 DIAGNOSIS — M8589 Other specified disorders of bone density and structure, multiple sites: Secondary | ICD-10-CM | POA: Diagnosis not present

## 2019-01-13 DIAGNOSIS — Z96652 Presence of left artificial knee joint: Secondary | ICD-10-CM | POA: Diagnosis not present

## 2019-01-27 DIAGNOSIS — Z1231 Encounter for screening mammogram for malignant neoplasm of breast: Secondary | ICD-10-CM | POA: Diagnosis not present

## 2019-01-27 DIAGNOSIS — Z0000 Encounter for general adult medical examination without abnormal findings: Secondary | ICD-10-CM | POA: Diagnosis not present

## 2019-01-27 DIAGNOSIS — N959 Unspecified menopausal and perimenopausal disorder: Secondary | ICD-10-CM | POA: Diagnosis not present

## 2019-01-27 DIAGNOSIS — E785 Hyperlipidemia, unspecified: Secondary | ICD-10-CM | POA: Diagnosis not present

## 2019-03-16 DIAGNOSIS — N959 Unspecified menopausal and perimenopausal disorder: Secondary | ICD-10-CM | POA: Diagnosis not present

## 2019-03-16 DIAGNOSIS — M8588 Other specified disorders of bone density and structure, other site: Secondary | ICD-10-CM | POA: Diagnosis not present

## 2019-03-16 DIAGNOSIS — R928 Other abnormal and inconclusive findings on diagnostic imaging of breast: Secondary | ICD-10-CM | POA: Diagnosis not present

## 2019-03-16 DIAGNOSIS — D0512 Intraductal carcinoma in situ of left breast: Secondary | ICD-10-CM | POA: Diagnosis not present

## 2019-03-18 DIAGNOSIS — D0512 Intraductal carcinoma in situ of left breast: Secondary | ICD-10-CM | POA: Diagnosis not present

## 2019-03-18 DIAGNOSIS — Z853 Personal history of malignant neoplasm of breast: Secondary | ICD-10-CM | POA: Diagnosis not present

## 2019-03-18 DIAGNOSIS — M8589 Other specified disorders of bone density and structure, multiple sites: Secondary | ICD-10-CM | POA: Diagnosis not present

## 2019-03-22 DIAGNOSIS — M8580 Other specified disorders of bone density and structure, unspecified site: Secondary | ICD-10-CM

## 2019-03-22 DIAGNOSIS — Z780 Asymptomatic menopausal state: Secondary | ICD-10-CM

## 2019-03-22 HISTORY — DX: Asymptomatic menopausal state: Z78.0

## 2019-03-22 HISTORY — DX: Other specified disorders of bone density and structure, unspecified site: M85.80

## 2019-07-04 DIAGNOSIS — E785 Hyperlipidemia, unspecified: Secondary | ICD-10-CM | POA: Diagnosis not present

## 2019-07-04 DIAGNOSIS — D0592 Unspecified type of carcinoma in situ of left breast: Secondary | ICD-10-CM | POA: Diagnosis not present

## 2019-07-04 DIAGNOSIS — M8589 Other specified disorders of bone density and structure, multiple sites: Secondary | ICD-10-CM | POA: Diagnosis not present

## 2019-07-04 DIAGNOSIS — E559 Vitamin D deficiency, unspecified: Secondary | ICD-10-CM | POA: Diagnosis not present

## 2019-08-23 DIAGNOSIS — Z0120 Encounter for dental examination and cleaning without abnormal findings: Secondary | ICD-10-CM | POA: Diagnosis not present

## 2019-11-01 DIAGNOSIS — Z471 Aftercare following joint replacement surgery: Secondary | ICD-10-CM | POA: Diagnosis not present

## 2019-11-01 DIAGNOSIS — Z96652 Presence of left artificial knee joint: Secondary | ICD-10-CM | POA: Diagnosis not present

## 2019-12-30 DIAGNOSIS — E559 Vitamin D deficiency, unspecified: Secondary | ICD-10-CM | POA: Diagnosis not present

## 2019-12-30 DIAGNOSIS — E785 Hyperlipidemia, unspecified: Secondary | ICD-10-CM | POA: Diagnosis not present

## 2019-12-30 DIAGNOSIS — D0592 Unspecified type of carcinoma in situ of left breast: Secondary | ICD-10-CM | POA: Diagnosis not present

## 2019-12-30 DIAGNOSIS — M8589 Other specified disorders of bone density and structure, multiple sites: Secondary | ICD-10-CM | POA: Diagnosis not present

## 2020-02-01 DIAGNOSIS — Z6835 Body mass index (BMI) 35.0-35.9, adult: Secondary | ICD-10-CM | POA: Diagnosis not present

## 2020-02-01 DIAGNOSIS — E785 Hyperlipidemia, unspecified: Secondary | ICD-10-CM | POA: Diagnosis not present

## 2020-02-01 DIAGNOSIS — Z1331 Encounter for screening for depression: Secondary | ICD-10-CM | POA: Diagnosis not present

## 2020-02-01 DIAGNOSIS — Z0000 Encounter for general adult medical examination without abnormal findings: Secondary | ICD-10-CM | POA: Diagnosis not present

## 2020-03-20 DIAGNOSIS — Z1231 Encounter for screening mammogram for malignant neoplasm of breast: Secondary | ICD-10-CM | POA: Diagnosis not present

## 2020-03-22 DIAGNOSIS — D0512 Intraductal carcinoma in situ of left breast: Secondary | ICD-10-CM | POA: Diagnosis not present

## 2020-03-22 DIAGNOSIS — Z86000 Personal history of in-situ neoplasm of breast: Secondary | ICD-10-CM | POA: Diagnosis not present

## 2020-03-22 DIAGNOSIS — M8589 Other specified disorders of bone density and structure, multiple sites: Secondary | ICD-10-CM | POA: Diagnosis not present

## 2020-08-06 ENCOUNTER — Encounter: Payer: Self-pay | Admitting: Oncology

## 2020-12-06 DIAGNOSIS — S32000A Wedge compression fracture of unspecified lumbar vertebra, initial encounter for closed fracture: Secondary | ICD-10-CM | POA: Diagnosis not present

## 2020-12-06 DIAGNOSIS — M5459 Other low back pain: Secondary | ICD-10-CM | POA: Diagnosis not present

## 2020-12-14 DIAGNOSIS — M545 Low back pain, unspecified: Secondary | ICD-10-CM | POA: Diagnosis not present

## 2020-12-17 DIAGNOSIS — M545 Low back pain, unspecified: Secondary | ICD-10-CM | POA: Diagnosis not present

## 2020-12-27 DIAGNOSIS — E785 Hyperlipidemia, unspecified: Secondary | ICD-10-CM | POA: Diagnosis not present

## 2020-12-27 DIAGNOSIS — M329 Systemic lupus erythematosus, unspecified: Secondary | ICD-10-CM | POA: Diagnosis not present

## 2020-12-27 DIAGNOSIS — E559 Vitamin D deficiency, unspecified: Secondary | ICD-10-CM | POA: Diagnosis not present

## 2021-01-02 DIAGNOSIS — E559 Vitamin D deficiency, unspecified: Secondary | ICD-10-CM | POA: Diagnosis not present

## 2021-01-02 DIAGNOSIS — Z853 Personal history of malignant neoplasm of breast: Secondary | ICD-10-CM | POA: Diagnosis not present

## 2021-01-02 DIAGNOSIS — M8589 Other specified disorders of bone density and structure, multiple sites: Secondary | ICD-10-CM | POA: Diagnosis not present

## 2021-01-02 DIAGNOSIS — E785 Hyperlipidemia, unspecified: Secondary | ICD-10-CM | POA: Diagnosis not present

## 2021-01-10 DIAGNOSIS — M5416 Radiculopathy, lumbar region: Secondary | ICD-10-CM | POA: Diagnosis not present

## 2021-01-23 DIAGNOSIS — M5459 Other low back pain: Secondary | ICD-10-CM | POA: Diagnosis not present

## 2021-02-06 DIAGNOSIS — E669 Obesity, unspecified: Secondary | ICD-10-CM | POA: Diagnosis not present

## 2021-02-06 DIAGNOSIS — E785 Hyperlipidemia, unspecified: Secondary | ICD-10-CM | POA: Diagnosis not present

## 2021-02-06 DIAGNOSIS — Z1331 Encounter for screening for depression: Secondary | ICD-10-CM | POA: Diagnosis not present

## 2021-02-06 DIAGNOSIS — Z0000 Encounter for general adult medical examination without abnormal findings: Secondary | ICD-10-CM | POA: Diagnosis not present

## 2021-02-18 DIAGNOSIS — M5459 Other low back pain: Secondary | ICD-10-CM | POA: Diagnosis not present

## 2021-03-06 DIAGNOSIS — M48062 Spinal stenosis, lumbar region with neurogenic claudication: Secondary | ICD-10-CM | POA: Diagnosis not present

## 2021-03-13 DIAGNOSIS — M5126 Other intervertebral disc displacement, lumbar region: Secondary | ICD-10-CM | POA: Diagnosis not present

## 2021-03-13 DIAGNOSIS — M48061 Spinal stenosis, lumbar region without neurogenic claudication: Secondary | ICD-10-CM | POA: Diagnosis not present

## 2021-03-13 DIAGNOSIS — S32040A Wedge compression fracture of fourth lumbar vertebra, initial encounter for closed fracture: Secondary | ICD-10-CM | POA: Diagnosis not present

## 2021-03-13 DIAGNOSIS — S32050A Wedge compression fracture of fifth lumbar vertebra, initial encounter for closed fracture: Secondary | ICD-10-CM | POA: Diagnosis not present

## 2021-03-19 DIAGNOSIS — M545 Low back pain, unspecified: Secondary | ICD-10-CM | POA: Diagnosis not present

## 2021-03-19 NOTE — Progress Notes (Signed)
[place]  [address] [place],    [address] [phone]  Clinic Day:  03/27/2021  Referring physician: Nicoletta Dress, MD  This document serves as a record of services personally performed by Hosie Poisson, MD. It was created on their behalf by [facility], a trained medical scribe. The creation of this record is based on the scribe's personal observations and the provider's statements to them.  CHIEF COMPLAINT:  CC: History of stage 0 hormone receptor positive ductal carcinoma in situ of the left breast  Current Treatment:  Surveillance   HISTORY OF PRESENT ILLNESS:  Tracy Luna is a 75 y.o. female with a history of stage 0 (Tis N0 M0) hormone receptor positive ductal carcinoma in situ of the left breast cancer diagnosed in August 2015.  Core needle biopsy revealed intermediate grade ductal carcinoma in situ.  She was treated with lumpectomy.  Pathology revealed a residual less than 0.2 cm low-grade ductal carcinoma in situ.  Estrogen and progesterone receptors were positive.  She opted not to have radiotherapy.  We began seeing her in October 2015 and placed her on chemoprevention with raloxifene.  She has labs drawn regularly at Dr. Denton Lank office.  She states she is up-to-date on screening colonoscopy.  She had cataract surgery.  She completed 5 years of raloxifene in October 2020.  Bone density from August 2020 revealed osteopenia of the AP spine.  INTERVAL HISTORY:  Tracy Luna is here for annual follow up and states that she has not been well lately.  She has had worsening back pain and MRI from August revealed remote L5 compression fracture with progressive height loss since May 2022. There is still some edematous signal at the fracture and adjacent discs suggesting incomplete healing.  Degenerative disease and retropulsion causes high-grade spinal stenosis at L4-5.  L5-S1 right foraminal impingement.  She is being seen by  Dr. Susa Day, orthopedic surgeon.  She has not been taking pain medication other than Tylenol.  She also is taking gabapentin.  Annual mammogram from August 19th was clear.  Bone density revealed a completely normal exam.  She has discontinued alendronate but continues calcium/vitamin D.  She undergoes routine lab work with Dr. Delena Bali.  Her  appetite is good, and she is eating well.  She denies fever, chills or other signs of infection.  She denies nausea, vomiting, bowel issues, or abdominal pain.  She denies sore throat, cough, dyspnea, or chest pain.  REVIEW OF SYSTEMS:  Review of Systems  Constitutional: Negative.  Negative for appetite change, chills, fatigue, fever and unexpected weight change.  HENT:  Negative.    Eyes: Negative.   Respiratory: Negative.  Negative for chest tightness, cough, hemoptysis, shortness of breath and wheezing.   Cardiovascular: Negative.  Negative for chest pain, leg swelling and palpitations.  Gastrointestinal: Negative.  Negative for abdominal distention, abdominal pain, blood in stool, constipation, diarrhea, nausea and vomiting.  Endocrine: Negative.   Genitourinary: Negative.  Negative for difficulty urinating, dysuria, frequency and hematuria.   Musculoskeletal:  Positive for back pain. Negative for arthralgias, flank pain, gait problem and myalgias.  Skin: Negative.   Neurological: Negative.  Negative for dizziness, extremity weakness, gait problem, headaches, light-headedness, numbness, seizures and speech difficulty.  Hematological: Negative.   Psychiatric/Behavioral: Negative.  Negative for depression and sleep disturbance. The patient is not nervous/anxious.     VITALS:  Blood pressure (!) 142/82, pulse 82, temperature 98.5 F (36.9 C), temperature source Oral, resp. rate 18,  height 5' 2.5" (1.588 m), weight 206 lb 8 oz (93.7 kg), SpO2 95 %.  Wt Readings from Last 3 Encounters:  03/27/21 206 lb 8 oz (93.7 kg)  03/22/20 206 lb (93.4 kg)   10/04/18 195 lb 8 oz (88.7 kg)    Body mass index is 37.17 kg/m.  Performance status (ECOG): 1 - Symptomatic but completely ambulatory  PHYSICAL EXAM:  Physical Exam Constitutional:      General: She is not in acute distress.    Appearance: Normal appearance. She is normal weight.  HENT:     Head: Normocephalic and atraumatic.  Eyes:     General: No scleral icterus.    Extraocular Movements: Extraocular movements intact.     Conjunctiva/sclera: Conjunctivae normal.     Pupils: Pupils are equal, round, and reactive to light.  Cardiovascular:     Rate and Rhythm: Normal rate and regular rhythm.     Pulses: Normal pulses.     Heart sounds: Normal heart sounds. No murmur heard.   No friction rub. No gallop.  Pulmonary:     Effort: Pulmonary effort is normal. No respiratory distress.     Breath sounds: Normal breath sounds.  Chest:     Comments: Well healed scar at 9 o'clock in the left breast.  There is a tiny nodule in the center of it, which is stable.  Both breasts are without masses. Abdominal:     General: Bowel sounds are normal. There is no distension.     Palpations: Abdomen is soft. There is no hepatomegaly, splenomegaly or mass.     Tenderness: There is no abdominal tenderness.  Musculoskeletal:        General: Normal range of motion.     Cervical back: Normal range of motion and neck supple.     Right lower leg: No edema.     Left lower leg: No edema.  Lymphadenopathy:     Cervical: No cervical adenopathy.  Skin:    General: Skin is warm and dry.  Neurological:     General: No focal deficit present.     Mental Status: She is alert and oriented to person, place, and time. Mental status is at baseline.  Psychiatric:        Mood and Affect: Mood normal.        Behavior: Behavior normal.        Thought Content: Thought content normal.        Judgment: Judgment normal.    LABS:   CBC Latest Ref Rng & Units 10/05/2018 09/28/2018  WBC 4.0 - 10.5 K/uL 16.6(H) 8.6   Hemoglobin 12.0 - 15.0 g/dL 11.5(L) 14.5  Hematocrit 36.0 - 46.0 % 36.0 46.1(H)  Platelets 150 - 400 K/uL 187 227   CMP Latest Ref Rng & Units 10/05/2018 09/28/2018  Glucose 70 - 99 mg/dL 107(H) 95  BUN 8 - 23 mg/dL 19 26(H)  Creatinine 0.44 - 1.00 mg/dL 0.69 0.78  Sodium 135 - 145 mmol/L 133(L) 140  Potassium 3.5 - 5.1 mmol/L 4.1 4.2  Chloride 98 - 111 mmol/L 104 106  CO2 22 - 32 mmol/L 22 28  Calcium 8.9 - 10.3 mg/dL 7.9(L) 9.0  Total Protein 6.5 - 8.1 g/dL - 7.6  Total Bilirubin 0.3 - 1.2 mg/dL - 0.8  Alkaline Phos 38 - 126 U/L - 44  AST 15 - 41 U/L - 17  ALT 0 - 44 U/L - 17     STUDIES:  No results found.   EXAM:  03/13/2021 MRI LUMBAR SPINE WITHOUT CONTRAST  TECHNIQUE: Multiplanar, multisequence MR imaging of the lumbar spine was performed. No intravenous contrast was administered.  COMPARISON:  12/11/2020  FINDINGS: Segmentation:  5 lumbar type vertebrae  Alignment: Mild L4-5 anterolisthesis, accentuated by posttraumatic deformity of L5.  Vertebrae: Progressive height loss at the L5 level which shows both sclerotic and edematous signal. No perispinal mass is noted.  Conus medullaris and cauda equina: Conus extends to the L1-2 level. Conus and cauda equina appear normal.  Paraspinal and other soft tissues: No evidence of inflammation or mass about the spine. Right renal cyst.  Disc levels:  T12- L1: Unremarkable.  L1-L2: Minor facet spurring  L2-L3: Mild facet spurring and ligamentum flavum thickening. Mild spinal stenosis  L3-L4: Mild facet spurring and ligamentum flavum thickening. Mild disc bulging.  L4-L5: Facet spurring with anterolisthesis. Exacerbating L4 fracture retropulsion with high-grade spinal stenosis. Patent foramina  L5-S1:Facet spurring and disc bulging causes right foraminal impingement. There is a degree of posttraumatic deformity of the inferior rightward endplate contributing to the stenosis.  IMPRESSION: 1. Remote L5  compression fracture with progressive height loss since May 2022. There is still some edematous signal at the fracture and adjacent discs suggesting incomplete healing. 2. Degenerative disease and retropulsion causes high-grade spinal stenosis at L4-5. 3. L5-S1 right foraminal impingement.   EXAM: 03/22/2021 DIGITAL SCREENING BILATERAL MAMMOGRAM WITH TOMOSYNTHESIS AND CAD  TECHNIQUE: Bilateral screening digital craniocaudal and mediolateral oblique mammograms were obtained. Bilateral screening digital breast tomosynthesis was performed. The images were evaluated with computer-aided detection.  COMPARISON:  Previous exam(s).  ACR Breast Density Category b: There are scattered areas of fibroglandular density.  FINDINGS: There are no findings suspicious for malignancy.  IMPRESSION: No mammographic evidence of malignancy. A result letter of this screening mammogram will be mailed directly to the patient.  EXAM: 03/22/2021 DUAL X-RAY ABSORPTIOMETRY (DXA) FOR BONE MINERAL DENSITY  IMPRESSION: Tracy Luna completed a BMD test on 03/22/2021 using the [place] (analysis version: 13.60) manufactured by [employer]. The following summarizes the results of our evaluation.  PATIENT BIOGRAPHICAL: Name: Tracy Luna, Tracy Luna Patient ID:  [id number] Artas Birth Date: 16-May-1946 Height:     63.5 in. Gender: Female Exam Date: 03/22/2021 Weight: 203.2 lbs. Indications:  M85.89      Fractures:            Treatments:  ASSESSMENT: The BMD measured at Femur Neck Left is 0.983 g/cm2 with a T-score of -0.4. This patient is considered normal according to [facility]) criteria. The scan quality is good. Exclusions: L3 & L4 were excluded due to degenerative changes.  Site Region Measured Measured WHO Young Adult BMD Date      Age      Classification T-score AP Spine L1-L2 5/22/[age] 73.6 Normal -0.5 1.100 g/cm2 AP Spine L1-L2 03/16/2019 71.5 Osteopenia -1.1 1.039  g/cm2 AP Spine L1-L2 02/27/2016 68.5 Normal -0.8 1.072 g/cm2 AP Spine L1-L2 02/08/2014 66.4 Osteopenia -1.1 1.039 g/cm2  DualFemur Neck Left 03/22/2021 73.6 Normal -0.4 0.983 g/cm2 DualFemur Neck Left 03/16/2019 71.5 Normal -0.1 1.023 g/cm2 DualFemur Neck Left 02/27/2016 68.5 Normal 0.0 1.036 g/cm2 DualFemur Neck Left 02/08/2014 66.4 Normal -0.2 1.016 g/cm2  DualFemur Total Mean 03/22/2021 73.6 Normal 0.6 1.089 g/cm2 DualFemur Total Mean 03/16/2019 71.5 Normal 0.7 1.096 g/cm2 DualFemur Total Mean 02/27/2016 68.5 Normal 0.6 1.083 g/cm2 DualFemur Total Mean 02/08/2014 66.4 Normal 0.5 1.077 g/cm2  Allergies: No Known Allergies  Current Medications: Current Outpatient Medications  Medication Sig Dispense Refill  psyllium (REGULOID) 0.52 g capsule Take 0.52 g by mouth daily.     atorvastatin (LIPITOR) 20 MG tablet Take 20 mg by mouth at bedtime.     CALCIUM-MAGNESIUM-VITAMIN D PO Take 1 tablet by mouth daily.     gabapentin (NEURONTIN) 300 MG capsule Take 300 mg by mouth 3 (three) times daily.     sodium chloride (AYR) 0.65 % nasal spray Place 1 spray into the nose as needed for congestion.     No current facility-administered medications for this visit.     ASSESSMENT & PLAN:   Assessment:   Stage 0 left breast cancer diagnosed in August 2015, now 7 years postop with no evidence of disease.    Osteopenia of the spine, now considered normal.  She will continue oral calcium/vitamin D.  We will continue bone density scans every 2 years.    3.  Degenerative disc disease and retropulsion causes high-grade spinal stenosis at L4-5.  L5-S1 right foraminal impingement.  She will continue to follow with Dr. Tonita Cong, and I will send him a copy of her bone density report as they are considering surgery.  Plan: I will send a copy of her bone density to Dr. Tonita Cong, which is now considered normal, and has improved on the alendronate.  I told her she could stop this.  We will see her back in 1 year with  bilateral mammogram for repeat evaluation.  She understands and agrees with this plan of care.   I provided 15 minutes of face-to-face time during this this encounter and > 50% was spent counseling as documented under my assessment and plan.    Derwood Kaplan, MD [facility] AT [facility] [address] [place] [address] Dept: [phone] Dept Fax: [phone]   I, Rita Ohara, am acting as scribe for Derwood Kaplan, MD  I have reviewed this report as typed by the medical scribe, and it is complete and accurate.

## 2021-03-22 DIAGNOSIS — M8589 Other specified disorders of bone density and structure, multiple sites: Secondary | ICD-10-CM | POA: Diagnosis not present

## 2021-03-22 DIAGNOSIS — Z1231 Encounter for screening mammogram for malignant neoplasm of breast: Secondary | ICD-10-CM | POA: Diagnosis not present

## 2021-03-22 DIAGNOSIS — M8588 Other specified disorders of bone density and structure, other site: Secondary | ICD-10-CM | POA: Diagnosis not present

## 2021-03-22 DIAGNOSIS — N959 Unspecified menopausal and perimenopausal disorder: Secondary | ICD-10-CM | POA: Diagnosis not present

## 2021-03-26 ENCOUNTER — Encounter: Payer: Self-pay | Admitting: Oncology

## 2021-03-27 ENCOUNTER — Encounter: Payer: Self-pay | Admitting: Oncology

## 2021-03-27 ENCOUNTER — Other Ambulatory Visit: Payer: Self-pay | Admitting: Oncology

## 2021-03-27 ENCOUNTER — Telehealth: Payer: Self-pay | Admitting: Oncology

## 2021-03-27 ENCOUNTER — Inpatient Hospital Stay: Payer: Medicare Other | Attending: Oncology | Admitting: Oncology

## 2021-03-27 DIAGNOSIS — Z780 Asymptomatic menopausal state: Secondary | ICD-10-CM

## 2021-03-27 DIAGNOSIS — D0512 Intraductal carcinoma in situ of left breast: Secondary | ICD-10-CM

## 2021-03-27 DIAGNOSIS — Z1239 Encounter for other screening for malignant neoplasm of breast: Secondary | ICD-10-CM

## 2021-03-27 DIAGNOSIS — M8580 Other specified disorders of bone density and structure, unspecified site: Secondary | ICD-10-CM

## 2021-03-27 NOTE — Telephone Encounter (Signed)
Per 8/24 LOS, patient scheduled for Aug 2023 Appt.  Sending Mammo Order to Scheduling to be scheduled w/patient

## 2021-04-02 ENCOUNTER — Encounter: Payer: Self-pay | Admitting: Oncology

## 2021-04-16 DIAGNOSIS — M545 Low back pain, unspecified: Secondary | ICD-10-CM | POA: Diagnosis not present

## 2021-04-16 DIAGNOSIS — S32000A Wedge compression fracture of unspecified lumbar vertebra, initial encounter for closed fracture: Secondary | ICD-10-CM | POA: Diagnosis not present

## 2021-04-17 DIAGNOSIS — M5459 Other low back pain: Secondary | ICD-10-CM | POA: Diagnosis not present

## 2021-04-17 DIAGNOSIS — M62551 Muscle wasting and atrophy, not elsewhere classified, right thigh: Secondary | ICD-10-CM | POA: Diagnosis not present

## 2021-04-17 DIAGNOSIS — S32000A Wedge compression fracture of unspecified lumbar vertebra, initial encounter for closed fracture: Secondary | ICD-10-CM | POA: Diagnosis not present

## 2021-04-17 DIAGNOSIS — R2689 Other abnormalities of gait and mobility: Secondary | ICD-10-CM | POA: Diagnosis not present

## 2021-04-19 DIAGNOSIS — S32000A Wedge compression fracture of unspecified lumbar vertebra, initial encounter for closed fracture: Secondary | ICD-10-CM | POA: Diagnosis not present

## 2021-04-19 DIAGNOSIS — R2689 Other abnormalities of gait and mobility: Secondary | ICD-10-CM | POA: Diagnosis not present

## 2021-04-19 DIAGNOSIS — M5459 Other low back pain: Secondary | ICD-10-CM | POA: Diagnosis not present

## 2021-04-19 DIAGNOSIS — M62551 Muscle wasting and atrophy, not elsewhere classified, right thigh: Secondary | ICD-10-CM | POA: Diagnosis not present

## 2021-04-23 DIAGNOSIS — M5459 Other low back pain: Secondary | ICD-10-CM | POA: Diagnosis not present

## 2021-04-23 DIAGNOSIS — R2689 Other abnormalities of gait and mobility: Secondary | ICD-10-CM | POA: Diagnosis not present

## 2021-04-23 DIAGNOSIS — S32000A Wedge compression fracture of unspecified lumbar vertebra, initial encounter for closed fracture: Secondary | ICD-10-CM | POA: Diagnosis not present

## 2021-04-23 DIAGNOSIS — M62551 Muscle wasting and atrophy, not elsewhere classified, right thigh: Secondary | ICD-10-CM | POA: Diagnosis not present

## 2021-04-26 DIAGNOSIS — M5459 Other low back pain: Secondary | ICD-10-CM | POA: Diagnosis not present

## 2021-04-26 DIAGNOSIS — R2689 Other abnormalities of gait and mobility: Secondary | ICD-10-CM | POA: Diagnosis not present

## 2021-04-26 DIAGNOSIS — M62551 Muscle wasting and atrophy, not elsewhere classified, right thigh: Secondary | ICD-10-CM | POA: Diagnosis not present

## 2021-04-26 DIAGNOSIS — S32000A Wedge compression fracture of unspecified lumbar vertebra, initial encounter for closed fracture: Secondary | ICD-10-CM | POA: Diagnosis not present

## 2021-04-30 DIAGNOSIS — M62551 Muscle wasting and atrophy, not elsewhere classified, right thigh: Secondary | ICD-10-CM | POA: Diagnosis not present

## 2021-04-30 DIAGNOSIS — M5459 Other low back pain: Secondary | ICD-10-CM | POA: Diagnosis not present

## 2021-04-30 DIAGNOSIS — R2689 Other abnormalities of gait and mobility: Secondary | ICD-10-CM | POA: Diagnosis not present

## 2021-04-30 DIAGNOSIS — S32000A Wedge compression fracture of unspecified lumbar vertebra, initial encounter for closed fracture: Secondary | ICD-10-CM | POA: Diagnosis not present

## 2021-05-03 DIAGNOSIS — M5459 Other low back pain: Secondary | ICD-10-CM | POA: Diagnosis not present

## 2021-05-03 DIAGNOSIS — R2689 Other abnormalities of gait and mobility: Secondary | ICD-10-CM | POA: Diagnosis not present

## 2021-05-03 DIAGNOSIS — M62551 Muscle wasting and atrophy, not elsewhere classified, right thigh: Secondary | ICD-10-CM | POA: Diagnosis not present

## 2021-05-03 DIAGNOSIS — S32000A Wedge compression fracture of unspecified lumbar vertebra, initial encounter for closed fracture: Secondary | ICD-10-CM | POA: Diagnosis not present

## 2021-05-06 DIAGNOSIS — M62551 Muscle wasting and atrophy, not elsewhere classified, right thigh: Secondary | ICD-10-CM | POA: Diagnosis not present

## 2021-05-06 DIAGNOSIS — R2689 Other abnormalities of gait and mobility: Secondary | ICD-10-CM | POA: Diagnosis not present

## 2021-05-06 DIAGNOSIS — M5459 Other low back pain: Secondary | ICD-10-CM | POA: Diagnosis not present

## 2021-05-06 DIAGNOSIS — S32000A Wedge compression fracture of unspecified lumbar vertebra, initial encounter for closed fracture: Secondary | ICD-10-CM | POA: Diagnosis not present

## 2021-05-08 DIAGNOSIS — M62551 Muscle wasting and atrophy, not elsewhere classified, right thigh: Secondary | ICD-10-CM | POA: Diagnosis not present

## 2021-05-08 DIAGNOSIS — M5459 Other low back pain: Secondary | ICD-10-CM | POA: Diagnosis not present

## 2021-05-08 DIAGNOSIS — S32000A Wedge compression fracture of unspecified lumbar vertebra, initial encounter for closed fracture: Secondary | ICD-10-CM | POA: Diagnosis not present

## 2021-05-08 DIAGNOSIS — R2689 Other abnormalities of gait and mobility: Secondary | ICD-10-CM | POA: Diagnosis not present

## 2021-05-14 DIAGNOSIS — M545 Low back pain, unspecified: Secondary | ICD-10-CM | POA: Diagnosis not present

## 2021-05-14 DIAGNOSIS — M4856XA Collapsed vertebra, not elsewhere classified, lumbar region, initial encounter for fracture: Secondary | ICD-10-CM | POA: Diagnosis not present

## 2021-05-14 DIAGNOSIS — M48061 Spinal stenosis, lumbar region without neurogenic claudication: Secondary | ICD-10-CM | POA: Diagnosis not present

## 2021-05-19 ENCOUNTER — Ambulatory Visit (HOSPITAL_COMMUNITY): Payer: Self-pay | Admitting: Orthopedic Surgery

## 2021-05-19 DIAGNOSIS — M8000XA Age-related osteoporosis with current pathological fracture, unspecified site, initial encounter for fracture: Secondary | ICD-10-CM

## 2021-05-20 ENCOUNTER — Encounter (HOSPITAL_COMMUNITY): Payer: Self-pay | Admitting: Orthopedic Surgery

## 2021-05-20 DIAGNOSIS — S32020A Wedge compression fracture of second lumbar vertebra, initial encounter for closed fracture: Secondary | ICD-10-CM | POA: Diagnosis not present

## 2021-05-20 DIAGNOSIS — Z6836 Body mass index (BMI) 36.0-36.9, adult: Secondary | ICD-10-CM | POA: Diagnosis not present

## 2021-05-20 DIAGNOSIS — M810 Age-related osteoporosis without current pathological fracture: Secondary | ICD-10-CM | POA: Diagnosis not present

## 2021-05-20 NOTE — Progress Notes (Signed)
DUE TO COVID-19 ONLY ONE VISITOR IS ALLOWED TO COME WITH YOU AND STAY IN THE WAITING ROOM ONLY DURING PRE OP AND PROCEDURE DAY OF SURGERY.   PCP - Dr Nelda Bucks Cardiologist - n/a Oncology - Dr Hosie Poisson  Chest x-ray - DOS (2V) EKG - n/a Stress Test - n/a ECHO - n/a Cardiac Cath - n/a  ICD Pacemaker/Loop - n/a  Sleep Study -  n/a CPAP - none  STOP now taking any Aspirin (unless otherwise instructed by your surgeon), Aleve, Naproxen, Ibuprofen, Motrin, Advil, Goody's, BC's, all herbal medications, fish oil, and all vitamins.   Coronavirus Screening Covid test n/a Ambulatory Surgery Do you have any of the following symptoms:  Cough yes/no: No Fever (>100.26F)  yes/no: No Runny nose yes/no: No Sore throat yes/no: No Difficulty breathing/shortness of breath  yes/no: No  Have you traveled in the last 14 days and where? yes/no: No  Patient verbalized understanding of instructions that were given via phone.

## 2021-05-22 ENCOUNTER — Ambulatory Visit (HOSPITAL_COMMUNITY): Payer: Medicare Other

## 2021-05-22 ENCOUNTER — Encounter (HOSPITAL_COMMUNITY): Payer: Self-pay | Admitting: Orthopedic Surgery

## 2021-05-22 ENCOUNTER — Ambulatory Visit (HOSPITAL_COMMUNITY)
Admission: RE | Admit: 2021-05-22 | Discharge: 2021-05-22 | Disposition: A | Discharge status: Home / Self Care | Payer: Medicare Other | Attending: Orthopedic Surgery | Admitting: Orthopedic Surgery

## 2021-05-22 ENCOUNTER — Encounter (HOSPITAL_COMMUNITY)
Admission: RE | Disposition: A | Discharge status: Home / Self Care | Payer: Self-pay | Source: Home / Self Care | Attending: Orthopedic Surgery

## 2021-05-22 ENCOUNTER — Other Ambulatory Visit: Payer: Self-pay

## 2021-05-22 ENCOUNTER — Ambulatory Visit (HOSPITAL_COMMUNITY): Payer: Medicare Other | Admitting: Certified Registered"

## 2021-05-22 DIAGNOSIS — Z981 Arthrodesis status: Secondary | ICD-10-CM | POA: Diagnosis not present

## 2021-05-22 DIAGNOSIS — Z01812 Encounter for preprocedural laboratory examination: Secondary | ICD-10-CM | POA: Diagnosis not present

## 2021-05-22 DIAGNOSIS — M48061 Spinal stenosis, lumbar region without neurogenic claudication: Secondary | ICD-10-CM | POA: Diagnosis not present

## 2021-05-22 DIAGNOSIS — M8000XA Age-related osteoporosis with current pathological fracture, unspecified site, initial encounter for fracture: Secondary | ICD-10-CM

## 2021-05-22 DIAGNOSIS — Z79899 Other long term (current) drug therapy: Secondary | ICD-10-CM | POA: Diagnosis not present

## 2021-05-22 DIAGNOSIS — Z01818 Encounter for other preprocedural examination: Secondary | ICD-10-CM | POA: Diagnosis not present

## 2021-05-22 DIAGNOSIS — M898X8 Other specified disorders of bone, other site: Secondary | ICD-10-CM | POA: Diagnosis not present

## 2021-05-22 DIAGNOSIS — Z87442 Personal history of urinary calculi: Secondary | ICD-10-CM | POA: Diagnosis not present

## 2021-05-22 DIAGNOSIS — E785 Hyperlipidemia, unspecified: Secondary | ICD-10-CM | POA: Diagnosis not present

## 2021-05-22 DIAGNOSIS — M8008XA Age-related osteoporosis with current pathological fracture, vertebra(e), initial encounter for fracture: Secondary | ICD-10-CM | POA: Insufficient documentation

## 2021-05-22 DIAGNOSIS — M8088XA Other osteoporosis with current pathological fracture, vertebra(e), initial encounter for fracture: Secondary | ICD-10-CM | POA: Diagnosis not present

## 2021-05-22 DIAGNOSIS — Z419 Encounter for procedure for purposes other than remedying health state, unspecified: Secondary | ICD-10-CM

## 2021-05-22 DIAGNOSIS — Z87891 Personal history of nicotine dependence: Secondary | ICD-10-CM | POA: Diagnosis not present

## 2021-05-22 DIAGNOSIS — J189 Pneumonia, unspecified organism: Secondary | ICD-10-CM | POA: Diagnosis not present

## 2021-05-22 HISTORY — DX: Hyperlipidemia, unspecified: E78.5

## 2021-05-22 HISTORY — PX: KYPHOPLASTY: SHX5884

## 2021-05-22 LAB — BASIC METABOLIC PANEL
Anion gap: 9 (ref 5–15)
BUN: 18 mg/dL (ref 8–23)
CO2: 25 mmol/L (ref 22–32)
Calcium: 9.5 mg/dL (ref 8.9–10.3)
Chloride: 107 mmol/L (ref 98–111)
Creatinine, Ser: 0.66 mg/dL (ref 0.44–1.00)
GFR, Estimated: >60 mL/min (ref >60)
Glucose, Bld: 101 mg/dL — ABNORMAL HIGH (ref 70–99)
Potassium: 4 mmol/L (ref 3.5–5.1)
Sodium: 141 mmol/L (ref 135–145)

## 2021-05-22 LAB — CBC
HCT: 45.9 % (ref 36.0–46.0)
Hemoglobin: 14.9 g/dL (ref 12.0–15.0)
MCH: 30.8 pg (ref 26.0–34.0)
MCHC: 32.5 g/dL (ref 30.0–36.0)
MCV: 94.8 fL (ref 80.0–100.0)
Platelets: 286 10*3/uL (ref 150–400)
RBC: 4.84 MIL/uL (ref 3.87–5.11)
RDW: 12.3 % (ref 11.5–15.5)
WBC: 10.5 10*3/uL (ref 4.0–10.5)
nRBC: 0 % (ref 0.0–0.2)

## 2021-05-22 LAB — SURGICAL PCR SCREEN
MRSA, PCR: NEGATIVE
Staphylococcus aureus: NEGATIVE

## 2021-05-22 LAB — PROTIME-INR
INR: 1 (ref 0.8–1.2)
Prothrombin Time: 13.3 seconds (ref 11.4–15.2)

## 2021-05-22 SURGERY — KYPHOPLASTY
Anesthesia: Monitor Anesthesia Care

## 2021-05-22 MED ORDER — FENTANYL CITRATE (PF) 250 MCG/5ML IJ SOLN
INTRAMUSCULAR | Status: DC | PRN
  Administered 2021-05-22: 50 ug via INTRAVENOUS

## 2021-05-22 MED ORDER — PHENYLEPHRINE 40 MCG/ML (10ML) SYRINGE FOR IV PUSH (FOR BLOOD PRESSURE SUPPORT)
PREFILLED_SYRINGE | INTRAVENOUS | Status: AC
  Filled 2021-05-22: qty 10

## 2021-05-22 MED ORDER — BUPIVACAINE LIPOSOME 1.3 % IJ SUSP
INTRAMUSCULAR | Status: AC
  Filled 2021-05-22: qty 20

## 2021-05-22 MED ORDER — DEXAMETHASONE SODIUM PHOSPHATE 10 MG/ML IJ SOLN
INTRAMUSCULAR | Status: DC | PRN
  Administered 2021-05-22: 8 mg via INTRAVENOUS

## 2021-05-22 MED ORDER — SUCCINYLCHOLINE CHLORIDE 200 MG/10ML IV SOSY
PREFILLED_SYRINGE | INTRAVENOUS | Status: AC
  Filled 2021-05-22: qty 10

## 2021-05-22 MED ORDER — ORAL CARE MOUTH RINSE: 15.0000 mL | Freq: Once | OROMUCOSAL | Status: AC

## 2021-05-22 MED ORDER — CEFAZOLIN SODIUM-DEXTROSE 2-4 GM/100ML-% IV SOLN
2.0000 g | INTRAVENOUS | Status: AC
  Administered 2021-05-22: 2 g via INTRAVENOUS
  Filled 2021-05-22: qty 100

## 2021-05-22 MED ORDER — PHENYLEPHRINE 40 MCG/ML (10ML) SYRINGE FOR IV PUSH (FOR BLOOD PRESSURE SUPPORT)
PREFILLED_SYRINGE | INTRAVENOUS | Status: DC | PRN
  Administered 2021-05-22 (×2): 120 ug via INTRAVENOUS
  Administered 2021-05-22: 160 ug via INTRAVENOUS

## 2021-05-22 MED ORDER — LACTATED RINGERS IV SOLN: INTRAVENOUS | Status: DC

## 2021-05-22 MED ORDER — BUPIVACAINE LIPOSOME 1.3 % IJ SUSP
INTRAMUSCULAR | Status: DC | PRN
  Administered 2021-05-22: 20 mL

## 2021-05-22 MED ORDER — ONDANSETRON HCL 4 MG/2ML IJ SOLN
INTRAMUSCULAR | Status: AC
  Filled 2021-05-22: qty 4

## 2021-05-22 MED ORDER — DEXAMETHASONE SODIUM PHOSPHATE 10 MG/ML IJ SOLN
INTRAMUSCULAR | Status: AC
  Filled 2021-05-22: qty 1

## 2021-05-22 MED ORDER — BUPIVACAINE-EPINEPHRINE 0.25% -1:200000 IJ SOLN
INTRAMUSCULAR | Status: DC | PRN
  Administered 2021-05-22: 20 mL
  Administered 2021-05-22: 10 mL

## 2021-05-22 MED ORDER — BUPIVACAINE-EPINEPHRINE (PF) 0.25% -1:200000 IJ SOLN
INTRAMUSCULAR | Status: AC
  Filled 2021-05-22: qty 30

## 2021-05-22 MED ORDER — LIDOCAINE 2% (20 MG/ML) 5 ML SYRINGE
INTRAMUSCULAR | Status: DC | PRN
  Administered 2021-05-22: 40 mg via INTRAVENOUS

## 2021-05-22 MED ORDER — TRAMADOL HCL 50 MG PO TABS: 50.0000 mg | ORAL_TABLET | Freq: Three times a day (TID) | ORAL | Status: AC | PRN

## 2021-05-22 MED ORDER — PROPOFOL 10 MG/ML IV BOLUS
INTRAVENOUS | Status: AC
  Filled 2021-05-22: qty 20

## 2021-05-22 MED ORDER — LIDOCAINE 2% (20 MG/ML) 5 ML SYRINGE
INTRAMUSCULAR | Status: AC
  Filled 2021-05-22: qty 10

## 2021-05-22 MED ORDER — FENTANYL CITRATE (PF) 250 MCG/5ML IJ SOLN
INTRAMUSCULAR | Status: AC
  Filled 2021-05-22: qty 5

## 2021-05-22 MED ORDER — 0.9 % SODIUM CHLORIDE (POUR BTL) OPTIME
TOPICAL | Status: DC | PRN
  Administered 2021-05-22: 1000 mL

## 2021-05-22 MED ORDER — FENTANYL CITRATE (PF) 100 MCG/2ML IJ SOLN: 25.0000 ug | INTRAMUSCULAR | Status: DC | PRN

## 2021-05-22 MED ORDER — PROPOFOL 500 MG/50ML IV EMUL
INTRAVENOUS | Status: DC | PRN
  Administered 2021-05-22: 150 ug/kg/min via INTRAVENOUS

## 2021-05-22 MED ORDER — TRANEXAMIC ACID-NACL 1000-0.7 MG/100ML-% IV SOLN
1000.0000 mg | INTRAVENOUS | Status: AC
  Administered 2021-05-22: 1000 mg via INTRAVENOUS
  Filled 2021-05-22: qty 100

## 2021-05-22 MED ORDER — ONDANSETRON HCL 4 MG PO TABS: 4.0000 mg | ORAL_TABLET | Freq: Three times a day (TID) | ORAL | 0 refills | Status: DC | PRN

## 2021-05-22 MED ORDER — CHLORHEXIDINE GLUCONATE 0.12 % MT SOLN
15.0000 mL | Freq: Once | OROMUCOSAL | Status: AC
  Administered 2021-05-22: 15 mL via OROMUCOSAL
  Filled 2021-05-22: qty 15

## 2021-05-22 MED ORDER — ONDANSETRON HCL 4 MG/2ML IJ SOLN
INTRAMUSCULAR | Status: DC | PRN
  Administered 2021-05-22: 4 mg via INTRAVENOUS

## 2021-05-22 SURGICAL SUPPLY — 39 items
BAG COUNTER SPONGE SURGICOUNT (BAG) IMPLANT
BLADE SURG 15 STRL LF DISP TIS (BLADE) ×1 IMPLANT
BLADE SURG 15 STRL SS (BLADE) ×1
BNDG ADH 1X3 SHEER STRL LF (GAUZE/BANDAGES/DRESSINGS) ×4 IMPLANT
CEMENT KYPHON CX01A KIT/MIXER (Cement) ×2 IMPLANT
COVER MAYO STAND STRL (DRAPES) ×2 IMPLANT
COVER SURGICAL LIGHT HANDLE (MISCELLANEOUS) ×2 IMPLANT
CURETTE EXPRESS SZ2 7MM (INSTRUMENTS) IMPLANT
CURETTE WEDGE 8.5MM KYPHX (MISCELLANEOUS) IMPLANT
CURRETTE EXPRESS SZ2 7MM (INSTRUMENTS)
DERMABOND ADVANCED (GAUZE/BANDAGES/DRESSINGS) ×1
DERMABOND ADVANCED .7 DNX12 (GAUZE/BANDAGES/DRESSINGS) ×1 IMPLANT
DEVICE BIOPSY BONE KYPHX (INSTRUMENTS) ×2 IMPLANT
DRAIN CHANNEL 15F RND FF W/TCR (WOUND CARE) IMPLANT
DRAPE C-ARM 42X72 X-RAY (DRAPES) ×4 IMPLANT
DRAPE INCISE IOBAN 66X45 STRL (DRAPES) ×2 IMPLANT
DRAPE LAPAROTOMY T 102X78X121 (DRAPES) ×2 IMPLANT
DRAPE WARM FLUID 44X44 (DRAPES) ×2 IMPLANT
DURAPREP 26ML APPLICATOR (WOUND CARE) ×2 IMPLANT
GLOVE SURG ENC MOIS LTX SZ6.5 (GLOVE) ×2 IMPLANT
GLOVE SURG MICRO LTX SZ8.5 (GLOVE) ×2 IMPLANT
GLOVE SURG UNDER POLY LF SZ6.5 (GLOVE) ×2 IMPLANT
GLOVE SURG UNDER POLY LF SZ8.5 (GLOVE) IMPLANT
GOWN STRL REUS W/ TWL LRG LVL3 (GOWN DISPOSABLE) ×2 IMPLANT
GOWN STRL REUS W/TWL 2XL LVL3 (GOWN DISPOSABLE) ×2 IMPLANT
GOWN STRL REUS W/TWL LRG LVL3 (GOWN DISPOSABLE) ×2
KIT BASIN OR (CUSTOM PROCEDURE TRAY) ×2 IMPLANT
KIT TURNOVER KIT B (KITS) ×2 IMPLANT
NEEDLE HYPO 22GX1.5 SAFETY (NEEDLE) ×2 IMPLANT
NEEDLE SPNL 22GX3.5 QUINCKE BK (NEEDLE) ×4 IMPLANT
NS IRRIG 1000ML POUR BTL (IV SOLUTION) ×2 IMPLANT
PACK SURGICAL SETUP 50X90 (CUSTOM PROCEDURE TRAY) ×2 IMPLANT
PAD ARMBOARD 7.5X6 YLW CONV (MISCELLANEOUS) ×4 IMPLANT
SPONGE T-LAP 4X18 ~~LOC~~+RFID (SPONGE) ×2 IMPLANT
SUT MNCRL AB 3-0 PS2 18 (SUTURE) ×2 IMPLANT
SYR CONTROL 10ML LL (SYRINGE) ×4 IMPLANT
TOWEL GREEN STERILE (TOWEL DISPOSABLE) ×2 IMPLANT
TRAY KYPHOPAK 20/3 ONESTEP 1ST (MISCELLANEOUS) ×2 IMPLANT
WATER STERILE IRR 1000ML POUR (IV SOLUTION) ×2 IMPLANT

## 2021-05-22 NOTE — Transfer of Care (Signed)
Immediate Anesthesia Transfer of Care Note  Patient: Tracy Luna  Procedure(s) Performed: L2 KYPHOPLASTY WITH BIOPSY  Patient Location: PACU  Anesthesia Type:MAC  Level of Consciousness: awake, alert  and oriented  Airway & Oxygen Therapy: Patient Spontanous Breathing  Post-op Assessment: Report given to RN and Patient moving all extremities X 4  Post vital signs: Reviewed and stable  Last Vitals:  Vitals Value Taken Time  BP 123/72   Temp    Pulse 98 05/22/21 1548  Resp 13 05/22/21 1548  SpO2 94 % 05/22/21 1548  Vitals shown include unvalidated device data.  Last Pain:  Vitals:   05/22/21 1319  TempSrc:   PainSc: 0-No pain         Complications: No notable events documented.

## 2021-05-22 NOTE — Progress Notes (Signed)
Patient ambulate ain PACU without any complications, increased pain or nausea. Void in bathroom without difficulty

## 2021-05-22 NOTE — Anesthesia Preprocedure Evaluation (Addendum)
Anesthesia Evaluation  Patient identified by MRN, date of birth, ID band Patient awake    Reviewed: Allergy & Precautions, H&P , NPO status , Patient's Chart, lab work & pertinent test results  Airway Mallampati: III  TM Distance: >3 FB Neck ROM: Full    Dental no notable dental hx. (+) Teeth Intact, Dental Advisory Given   Pulmonary neg pulmonary ROS, Patient abstained from smoking., former smoker,    Pulmonary exam normal breath sounds clear to auscultation       Cardiovascular negative cardio ROS   Rhythm:Regular Rate:Normal     Neuro/Psych negative neurological ROS  negative psych ROS   GI/Hepatic negative GI ROS, Neg liver ROS,   Endo/Other  Morbid obesity  Renal/GU negative Renal ROS  negative genitourinary   Musculoskeletal  (+) Arthritis , Osteoarthritis,    Abdominal   Peds  Hematology negative hematology ROS (+)   Anesthesia Other Findings   Reproductive/Obstetrics negative OB ROS                            Anesthesia Physical Anesthesia Plan  ASA: 2  Anesthesia Plan: MAC   Post-op Pain Management:    Induction: Intravenous  PONV Risk Score and Plan: 3 and Ondansetron, Dexamethasone, Treatment may vary due to age or medical condition and Propofol infusion  Airway Management Planned: Simple Face Mask  Additional Equipment:   Intra-op Plan:   Post-operative Plan:   Informed Consent: I have reviewed the patients History and Physical, chart, labs and discussed the procedure including the risks, benefits and alternatives for the proposed anesthesia with the patient or authorized representative who has indicated his/her understanding and acceptance.     Dental advisory given  Plan Discussed with: CRNA  Anesthesia Plan Comments:        Anesthesia Quick Evaluation

## 2021-05-22 NOTE — Op Note (Signed)
OPERATIVE REPORT  DATE OF SURGERY: 05/22/2021  PATIENT NAME:  Tracy Luna MRN: [id number] DOB: 1945-10-04  PCP: Nicoletta Dress, MD  PRE-OPERATIVE DIAGNOSIS: L2 osteoporotic compression fracture  POST-OPERATIVE DIAGNOSIS: Same  PROCEDURE:   L2 kyphoplasty.  Biopsy of L2 vertebral body  SURGEON:  Melina Schools, MD  PHYSICIAN ASSISTANT: None  ANESTHESIA:   General  EBL: Minimal   Complications: None  BRIEF HISTORY: Tracy Luna is a 75 y.o. female who presents to my office with complaints of significant upper lumbar pain imaging studies demonstrated an L2 osteoporotic compression fracture.  Because of a questionable history and possible malignancy we elected to move forward with a biopsy as well as the kyphoplasty to help address her pain.  All appropriate risks benefits and alternatives to surgery were discussed with the patient and consent was obtained  PROCEDURE DETAILS: Patient was brought into the operating room and was properly positioned on the operating room table.  After induction with IV sedation a timeout was taken to confirm all important data: including patient, procedure, and the level. Teds, SCD's were applied.   Patient was placed prone onto the operating room table.  Once she was comfortable and the IV sedation was working well we proceeded with the surgery.  The back was prepped and draped in a standard fashion.  Using fluoroscopy identified the L2 vertebral body in both the AP and lateral planes.  I marked out the lateral aspect of the pedicles of L2 and infiltrated the area with quarter percent Marcaine with epinephrine.  Once I had local anesthesia I then used a spinal needle and advanced it down to the lateral aspect of the facet.  I then injected around the periosteum and deep soft tissue in order for improved analgesia.  This was done with Marcaine with Exparel.  Once the patient was assured to be comfortable a small incision was made  and the Jamshidi needle was advanced percutaneously down to the lateral aspect of the L2 pedicle.  Using AP fluoroscopy I advanced the Jamshidi needle into the pedicle of L2.  As I neared the medial wall I confirmed on the lateral view that I was just beyond the posterior wall of the vertebral body once I confirm my trajectory advanced into the vertebral body.  The coring biopsy needle was then inserted through the trocar and I did get a small core sample of the L2 vertebral body.  This was saved on the back table.  I then repeated this procedure on the contralateral side.  Using the same technique the contralateral pedicle was cannulated.  I then inserted the drill and then sounded the bony canal to ensure was solid.  The inflatable bone tamps were then inserted and I inflated them to create my void.  I then deflated the balloons and inserted a total of 3 cc of cement on each side.  Using fluoroscopy I confirmed satisfactory positioning of the cement during insertion.  Cement was allowed to harden and I remove the trocar.  Final x-rays demonstrated satisfactory position of the cement with no leak noted.  The skin was then cleaned and the small stab incisions were closed with a simple 3-0 Monocryl stitch.  Dermabond and a Band-Aid were applied and the patient was transferred to the PACU without incident.  The end of the case all needle sponge counts were correct.  There were no adverse intraoperative events.  Melina Schools, MD 05/22/2021 3:44 PM

## 2021-05-22 NOTE — H&P (Signed)
HPI Tracy Luna is a pleasant 75 year old female who was referred to [place] by Dr. Tonita Cong. She had an L5 compression fracture which occurred several months ago, she underwent physical therapy and eventually her low back pain improved. When her pain improved she began to ambulate more frequently at which point she noticed bilateral lower extremity symptoms consistent with spinal stenosis. She uses Tylenol and gabapentin for pain. A proximally 1 week ago she began having more significant low back pain. She does have a history of osteopenia and was previously on medications, she states her recent bone density/DEXA scan was normal and the patient stopped her medication. Now she takes vitamin D and calcium. She had an epidural steroid injection in June which did not give her any significant relief.  ROS None recorded.  Allergies NKDA  Medications atorvastatin 20 mg tablet gabapentin 300 mg capsule  Problems Low back pain - Onset: 12/06/2020 Lumbar radiculopathy - Onset: 12/20/2020 Wedge fracture of lumbar vertebra - Onset: 12/06/2020  Physical Exam General: AAOX3, well developed and well nourished, NAD Ambulation: abnormal gait pattern, uses walker assistive device. Heart: Regular rate and rhythm, no rubs, murmurs, or gallops  Lungs: Clear auscultation bilaterally  Abdomen: Bowel sounds 4, nontender, nondistended, no rebound tenderness. No loss of bladder or bowel control  Inspection: No obvious deformity Palpation: Tender over Midline upper lumbar region AROM: - Significant pain with range of motion of lumbar spine - Knee: flexion and extension normal and pain free bilaterally. - Ankle: Dorsiflexion, plantarflexion, inversion, eversion normal and pain free. Dermatomes: Patient complains of bilateral lower extremity pain and dysesthesias when standing or walking for any period of time. Symptoms are relieved with forward flexion or sitting. Myotomes: - 5/5 lower extremity motor strength  bilaterally Reflexes: - Clonus: Negative Special Tests: - Straight Leg Raise: Left Negative, Right Negative  PV: Extremities warm and well profused. No lower extremity edema  X-Ray impression: AP, lateral, spot lumbar films were seen at today's office visit given new onset low back pain, they were compared to x-rays from 1 month ago on 04/16/2021. There is a new L2 compression fracture and it appears that the L5 fracture may have collapsed further.  MRI Impression: MRI of lumbar spine performed on 03/13/2021 Showed L5 compression fracture with some acute features. Severe spinal canal stenosis at L4-5, right foraminal impingement at L5-S1   Assessment / Plan Assessment: Tranice is a pleasant 75 year old female with multiple lumbar spine issues.  She has a very collapsed L5 compression deformity with incomplete healing that is likely contributing to her lower lumbar pain, but we would not be able to augment this level with a kyphoplasty given the significant collapse.  She has a new, within the last month, L2 compression fracture which is likely the cause of her acute onset worsening low back pain over the last few weeks.  Past medical history of osteopenia, was on medication, discontinued medication as a recent DEXA scan was within normal limits. There is some question in regards to the reliability of the DEXA scan given that the patient has had 2 recent lumbar compression fractures without any injury.  The patient also has severe spinal stenosis at L4-5 and right-sided impingement at L5-S1. This may be contributing to her leg pain when she is standing or walking for any period of time that is better with forward flexion.  Plan: 1. For the acute L2 compression fracture I have recommended a kyphoplasty procedure. Risks of surgery include: Infection, bleeding, death, stroke, paralysis, nerve damage, leak  of cement, need for additional surgery including open decompression. Ongoing or worse  pain. Goals of surgery: Reduction in pain, and improvement in quality of life. The patient does have a history of stage 0 breast cancer, and a recent DEXA scan was within normal limits we will plan to get a biopsy during this procedure. 2. For further evaluation/I am recommending the patient see an endocrinologist. I have put in a referral for this 3. In regards to the leg symptoms related to the spinal stenosis, I think this would likely require a lumbar decompression at least at L4-5, possibly also at L5-S1. Given the incompletely healed L5 compression fracture I would certainly be hesitant to do a decompression of these level(s) without instrumentation given the instability. Given that the patient is relatively sedentary currently due to her significant pain and her older age, I would be hesitant to recommend such a large procedure at this time For concern that she may not have a smooth recovery. Furthermore, the use of hardware is less than ideal given her poor bone quality currently. In the future, if we are able to decrease the patient's pain to get her more mobile then we may revisit the idea of this larger surgery in order to reduce her spinal stenosis symptoms. 4. For her pain I have refilled her gabapentin. She can continue taking Tylenol. I also recommend the use of topical lidocaine patches. We briefly discussed a TLSO brace to restrict her motion and therefore decrease her pain. She declined at this time.  We will plan to move forward with L2 kyphoplasty procedure .  Medical clearance from her primary care provider was confirmed.

## 2021-05-23 ENCOUNTER — Encounter (HOSPITAL_COMMUNITY): Payer: Self-pay | Admitting: Orthopedic Surgery

## 2021-05-23 NOTE — Anesthesia Postprocedure Evaluation (Signed)
Anesthesia Post Note  Patient: Tracy Luna  Procedure(s) Performed: L2 KYPHOPLASTY WITH BIOPSY     Patient location during evaluation: PACU Anesthesia Type: MAC Level of consciousness: awake and alert Pain management: pain level controlled Vital Signs Assessment: post-procedure vital signs reviewed and stable Respiratory status: spontaneous breathing, nonlabored ventilation and respiratory function stable Cardiovascular status: blood pressure returned to baseline and stable Postop Assessment: no apparent nausea or vomiting Anesthetic complications: no   No notable events documented.  Last Vitals:  Vitals:   05/22/21 1635 05/22/21 1650  BP: (!) 145/65 (!) 155/65  Pulse: 71 72  Resp: 20 18  Temp: 36.5 C   SpO2: 95% 99%    Last Pain:  Vitals:   05/22/21 1635  TempSrc:   PainSc: 0-No pain                 Lynda Rainwater

## 2021-05-27 LAB — SURGICAL PATHOLOGY

## 2021-07-02 DIAGNOSIS — Z4889 Encounter for other specified surgical aftercare: Secondary | ICD-10-CM | POA: Diagnosis not present

## 2021-07-08 DIAGNOSIS — M8589 Other specified disorders of bone density and structure, multiple sites: Secondary | ICD-10-CM | POA: Diagnosis not present

## 2021-07-08 DIAGNOSIS — E559 Vitamin D deficiency, unspecified: Secondary | ICD-10-CM | POA: Diagnosis not present

## 2021-07-08 DIAGNOSIS — Z853 Personal history of malignant neoplasm of breast: Secondary | ICD-10-CM | POA: Diagnosis not present

## 2021-07-08 DIAGNOSIS — E785 Hyperlipidemia, unspecified: Secondary | ICD-10-CM | POA: Diagnosis not present

## 2021-11-21 IMAGING — RF DG THORACOLUMBAR SPINE 2V
1 series · 3 of 3 positions shown · non-contrast
Comparison: None.

CLINICAL DATA: L2 kyphoplasty

EXAM:
THORACOLUMBAR SPINE - 2 VIEW

[Series 1: run · 3 of 3 slices shown]
[im 1/3]
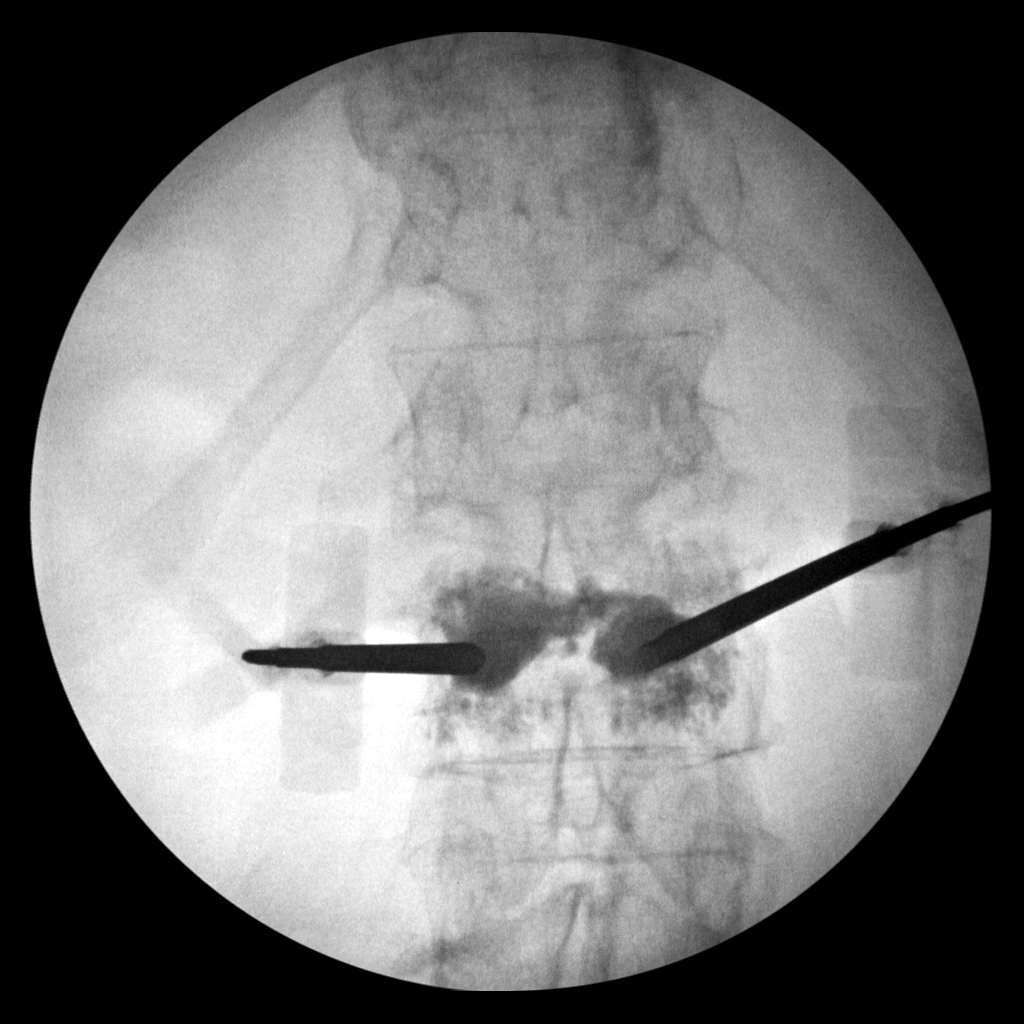
[im 2/3]
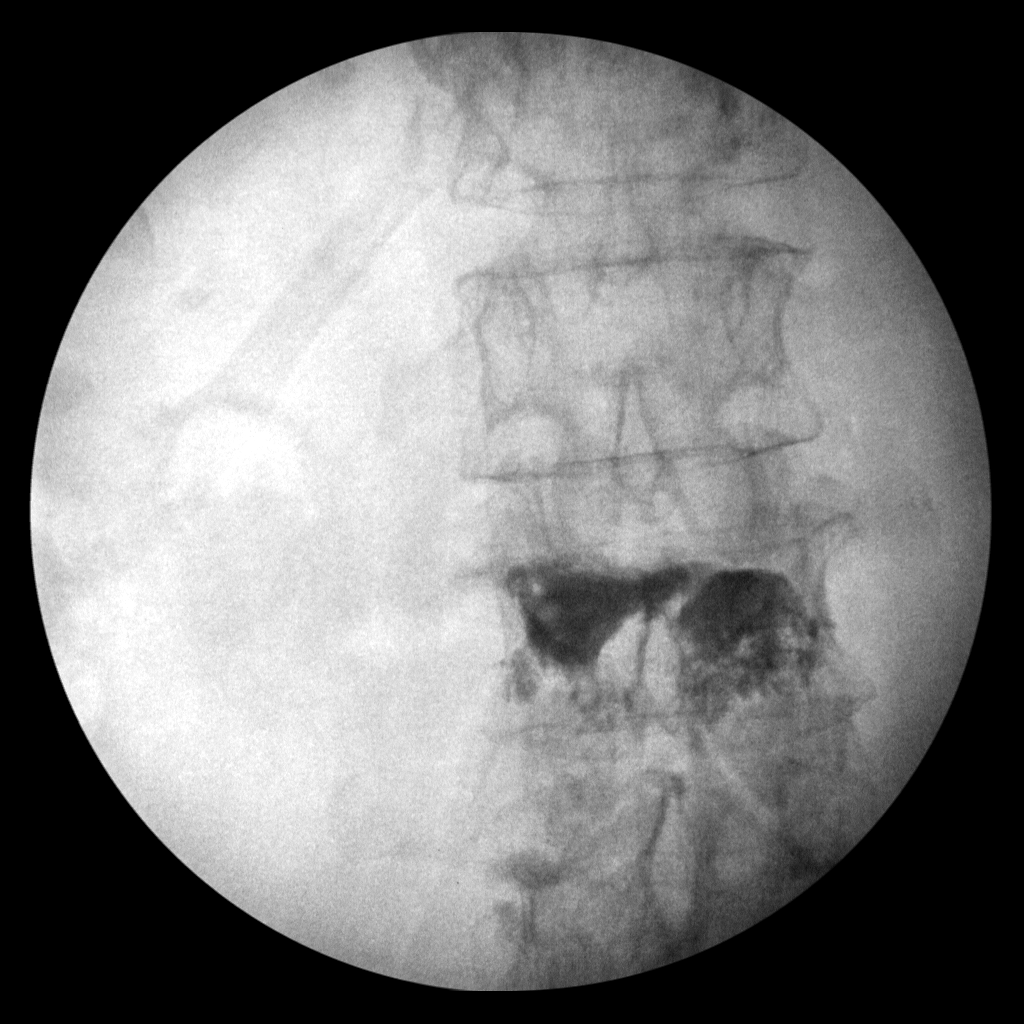
[im 3/3]
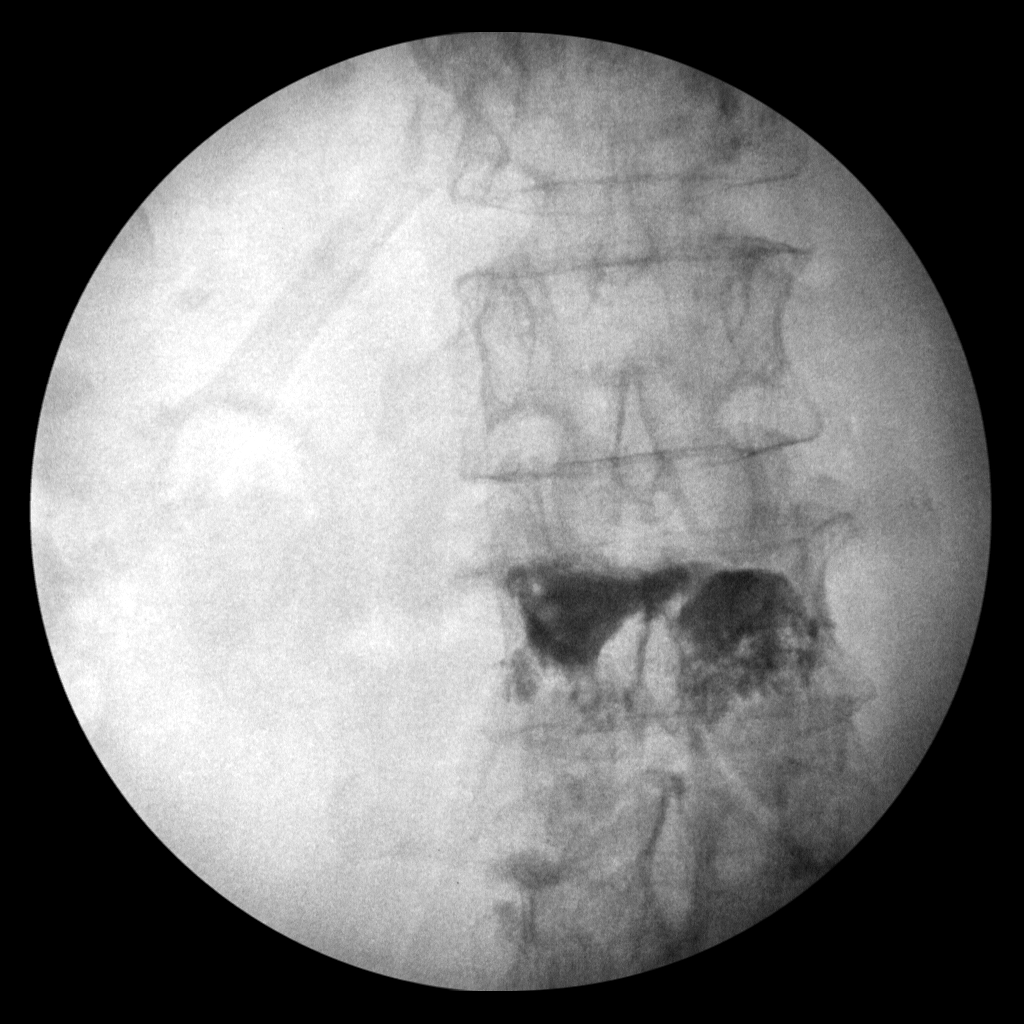

[3 of 3 positions shown; findings below may reference images not displayed]

FLUOROSCOPY TIME:  Radiation Exposure Index (as provided by the
fluoroscopic device): 93.52 mGy

If the device does not provide the exposure index:

Fluoroscopy Time:  106 seconds

Number of Acquired Images:  5
FINDINGS: Needle cannulas are noted bilaterally at the L2 vertebral body with
contrast laden cement distributed throughout the vertebral body.
IMPRESSION: L2 kyphoplasty.

## 2021-12-09 DIAGNOSIS — M5451 Vertebrogenic low back pain: Secondary | ICD-10-CM | POA: Diagnosis not present

## 2022-01-06 DIAGNOSIS — E559 Vitamin D deficiency, unspecified: Secondary | ICD-10-CM | POA: Diagnosis not present

## 2022-01-06 DIAGNOSIS — Z853 Personal history of malignant neoplasm of breast: Secondary | ICD-10-CM | POA: Diagnosis not present

## 2022-01-06 DIAGNOSIS — E785 Hyperlipidemia, unspecified: Secondary | ICD-10-CM | POA: Diagnosis not present

## 2022-01-06 DIAGNOSIS — M8589 Other specified disorders of bone density and structure, multiple sites: Secondary | ICD-10-CM | POA: Diagnosis not present

## 2022-02-10 DIAGNOSIS — E785 Hyperlipidemia, unspecified: Secondary | ICD-10-CM | POA: Diagnosis not present

## 2022-02-10 DIAGNOSIS — Z1331 Encounter for screening for depression: Secondary | ICD-10-CM | POA: Diagnosis not present

## 2022-02-10 DIAGNOSIS — E669 Obesity, unspecified: Secondary | ICD-10-CM | POA: Diagnosis not present

## 2022-02-10 DIAGNOSIS — Z0000 Encounter for general adult medical examination without abnormal findings: Secondary | ICD-10-CM | POA: Diagnosis not present

## 2022-02-18 DIAGNOSIS — M810 Age-related osteoporosis without current pathological fracture: Secondary | ICD-10-CM | POA: Diagnosis not present

## 2022-03-27 ENCOUNTER — Ambulatory Visit: Payer: Medicare Other | Admitting: Oncology

## 2022-04-14 DIAGNOSIS — Z1231 Encounter for screening mammogram for malignant neoplasm of breast: Secondary | ICD-10-CM | POA: Diagnosis not present

## 2022-04-16 ENCOUNTER — Inpatient Hospital Stay: Payer: Medicare Other | Attending: Oncology | Admitting: Oncology

## 2022-04-16 ENCOUNTER — Other Ambulatory Visit: Payer: Self-pay | Admitting: Oncology

## 2022-04-16 ENCOUNTER — Encounter: Payer: Self-pay | Admitting: Oncology

## 2022-04-16 VITALS — BP 186/87 | HR 74 | Temp 97.5°F | Resp 16 | Ht 64.0 in | Wt 212.1 lb

## 2022-04-16 DIAGNOSIS — D0512 Intraductal carcinoma in situ of left breast: Secondary | ICD-10-CM | POA: Diagnosis not present

## 2022-04-16 NOTE — Progress Notes (Signed)
[place]  [address] [place],  [place]  [address] [phone]  Clinic Day: 04/16/22  Referring physician: Nicoletta Dress, MD  CHIEF COMPLAINT:  CC: History of stage 0 hormone receptor positive ductal carcinoma in situ of the left breast  Current Treatment:  Surveillance   HISTORY OF PRESENT ILLNESS:  Tracy Luna is a 76 y.o. female with a history of stage 0 (Tis N0 M0) hormone receptor positive ductal carcinoma in situ of the left breast cancer diagnosed in August 2015.  Core needle biopsy revealed intermediate grade ductal carcinoma in situ.  She was treated with lumpectomy.  Pathology revealed a residual less than 0.2 cm low-grade ductal carcinoma in situ.  Estrogen and progesterone receptors were positive.  She opted not to have radiotherapy.  We began seeing her in October 2015 and placed her on chemoprevention with raloxifene.  She has labs drawn regularly at Dr. Denton Lank office.  She states she is up-to-date on screening colonoscopy.  She had cataract surgery.  She completed 5 years of raloxifene in October 2020.  Bone density from August 2020 revealed osteopenia of the AP spine.She has had worsening back pain and MRI from August of 2023 revealed remote L5 compression fracture with progressive height loss since May 2022. Degenerative disease and retropulsion causes high-grade spinal stenosis at L4-5, and  L5-S1 right foraminal impingement.   INTERVAL HISTORY:  Tracy Luna is here for annual follow up and states that she is doing better. She has not been taking pain medication other than Tylenol and gabapentin.  Annual mammogram from September 11 was clear.  Bone density in 2022 revealed a completely normal exam, which is hard to believe.  She is still on Forteo through her orthopedic surgeon.  She undergoes routine lab work with Dr. Delena Bali.  Her  appetite is good, and she is eating well.  She denies fever, chills or other signs of  infection.  She denies nausea, vomiting, bowel issues, or abdominal pain.  She denies sore throat, cough, dyspnea, or chest pain.  She has gained 6 pounds in the last year.  REVIEW OF SYSTEMS:  Review of Systems  Constitutional: Negative.  Negative for appetite change, chills, fatigue, fever and unexpected weight change.  HENT:  Negative.    Eyes: Negative.   Respiratory: Negative.  Negative for chest tightness, cough, hemoptysis, shortness of breath and wheezing.   Cardiovascular: Negative.  Negative for chest pain, leg swelling and palpitations.  Gastrointestinal: Negative.  Negative for abdominal distention, abdominal pain, blood in stool, constipation, diarrhea, nausea and vomiting.  Endocrine: Negative.   Genitourinary: Negative.  Negative for difficulty urinating, dysuria, frequency and hematuria.   Musculoskeletal:  Positive for back pain. Negative for arthralgias, flank pain, gait problem and myalgias.  Skin: Negative.   Neurological: Negative.  Negative for dizziness, extremity weakness, gait problem, headaches, light-headedness, numbness, seizures and speech difficulty.  Hematological: Negative.   Psychiatric/Behavioral: Negative.  Negative for depression and sleep disturbance. The patient is not nervous/anxious.      VITALS:  Blood pressure (!) 186/87, pulse 74, temperature (!) 97.5 F (36.4 C), temperature source Oral, resp. rate 16, height '5\' 4"'$  (1.626 m), weight 212 lb 1.6 oz (96.2 kg), SpO2 96 %.  Wt Readings from Last 3 Encounters:  04/16/22 212 lb 1.6 oz (96.2 kg)  05/22/21 211 lb (95.7 kg)  03/27/21 206 lb 8 oz (93.7 kg)    Body mass index is 36.41 kg/m.  Performance status (ECOG): 1 -  Symptomatic but completely ambulatory  PHYSICAL EXAM:  Physical Exam Constitutional:      General: She is not in acute distress.    Appearance: Normal appearance. She is normal weight.  HENT:     Head: Normocephalic and atraumatic.  Eyes:     General: No scleral icterus.     Extraocular Movements: Extraocular movements intact.     Conjunctiva/sclera: Conjunctivae normal.     Pupils: Pupils are equal, round, and reactive to light.  Cardiovascular:     Rate and Rhythm: Normal rate and regular rhythm.     Pulses: Normal pulses.     Heart sounds: Normal heart sounds. No murmur heard.    No friction rub. No gallop.  Pulmonary:     Effort: Pulmonary effort is normal. No respiratory distress.     Breath sounds: Normal breath sounds.  Chest:     Comments: Well healed scar at 9 o'clock in the left breast.  There is a tiny nodule in the center of it, which is stable.  Both breasts are without masses. Abdominal:     General: Bowel sounds are normal. There is no distension.     Palpations: Abdomen is soft. There is no hepatomegaly, splenomegaly or mass.     Tenderness: There is no abdominal tenderness.  Musculoskeletal:        General: Normal range of motion.     Cervical back: Normal range of motion and neck supple.     Right lower leg: No edema.     Left lower leg: No edema.  Lymphadenopathy:     Cervical: No cervical adenopathy.  Skin:    General: Skin is warm and dry.  Neurological:     General: No focal deficit present.     Mental Status: She is alert and oriented to person, place, and time. Mental status is at baseline.  Psychiatric:        Mood and Affect: Mood normal.        Behavior: Behavior normal.        Thought Content: Thought content normal.        Judgment: Judgment normal.     LABS:      Latest Ref Rng & Units 05/22/2021   12:56 PM 10/05/2018    5:41 AM 09/28/2018    9:38 AM  CBC  WBC 4.0 - 10.5 K/uL 10.5  16.6  8.6   Hemoglobin 12.0 - 15.0 g/dL 14.9  11.5  14.5   Hematocrit 36.0 - 46.0 % 45.9  36.0  46.1   Platelets 150 - 400 K/uL 286  187  227       Latest Ref Rng & Units 05/22/2021   12:56 PM 10/05/2018    5:41 AM 09/28/2018    9:38 AM  CMP  Glucose 70 - 99 mg/dL 101  107  95   BUN 8 - 23 mg/dL '18  19  26   '$ Creatinine 0.44 -  1.00 mg/dL 0.66  0.69  0.78   Sodium 135 - 145 mmol/L 141  133  140   Potassium 3.5 - 5.1 mmol/L 4.0  4.1  4.2   Chloride 98 - 111 mmol/L 107  104  106   CO2 22 - 32 mmol/L '25  22  28   '$ Calcium 8.9 - 10.3 mg/dL 9.5  7.9  9.0   Total Protein 6.5 - 8.1 g/dL   7.6   Total Bilirubin 0.3 - 1.2 mg/dL   0.8   Alkaline Phos 38 -  126 U/L   44   AST 15 - 41 U/L   17   ALT 0 - 44 U/L   17      STUDIES:   EXAM: 03/22/2021 DUAL X-RAY ABSORPTIOMETRY (DXA) FOR BONE MINERAL DENSITY  IMPRESSION: Woodroe Chen completed a BMD test on 03/22/2021 using the [place] (analysis version: 13.60) manufactured by [employer]. The following summarizes the results of our evaluation.  PATIENT BIOGRAPHICAL: Name: LAQUIESHA, PIACENTE Patient ID:  [id number] Hanna Birth Date: Jun 13, 1946 Height:     63.5 in. Gender: Female Exam Date: 03/22/2021 Weight: 203.2 lbs. Indications:  M85.89      Fractures:            Treatments:  ASSESSMENT: The BMD measured at Femur Neck Left is 0.983 g/cm2 with a T-score of -0.4. This patient is considered normal according to [facility]) criteria. The scan quality is good. Exclusions: L3 & L4 were excluded due to degenerative changes.  Site Region Measured Measured WHO Young Adult BMD Date      Age      Classification T-score AP Spine L1-L2 5/22/[age] 73.6 Normal -0.5 1.100 g/cm2 AP Spine L1-L2 03/16/2019 71.5 Osteopenia -1.1 1.039 g/cm2 AP Spine L1-L2 02/27/2016 68.5 Normal -0.8 1.072 g/cm2 AP Spine L1-L2 02/08/2014 66.4 Osteopenia -1.1 1.039 g/cm2  DualFemur Neck Left 5/22/2023 73.6 Normal -0.4 0.983 g/cm2 DualFemur Neck Left 03/16/2019 71.5 Normal -0.1 1.023 g/cm2 DualFemur Neck Left 02/27/2016 68.5 Normal 0.0 1.036 g/cm2 DualFemur Neck Left 02/08/2014 66.4 Normal -0.2 1.016 g/cm2  DualFemur Total Mean 03/22/2021 73.6 Normal 0.6 1.089 g/cm2 DualFemur Total Mean 03/16/2019 71.5 Normal 0.7 1.096 g/cm2 DualFemur Total Mean 02/27/2016 68.5 Normal 0.6 1.083  g/cm2 DualFemur Total Mean 02/08/2014 66.4 Normal 0.5 1.077 g/cm2  Allergies: No Known Allergies  Current Medications: Current Outpatient Medications  Medication Sig Dispense Refill   acetaminophen (TYLENOL) 500 MG tablet Take 1,000 mg by mouth every 6 (six) hours as needed.     atorvastatin (LIPITOR) 20 MG tablet Take 20 mg by mouth at bedtime.     Calcium Carb-Cholecalciferol (CALCIUM 600 + D PO) Take 1 tablet by mouth daily.     Carboxymethylcellul-Glycerin (LUBRICATING EYE DROPS OP) Place 1 drop into both eyes daily as needed (dry/tired eyes).     Cholecalciferol (DIALYVITE VITAMIN D 5000) 125 MCG (5000 UT) capsule Take 5,000 Units by mouth daily.     METAMUCIL FIBER PO Take 1 Scoop by mouth daily.     No current facility-administered medications for this visit.     ASSESSMENT & PLAN:   Assessment:   Stage 0 left breast cancer diagnosed in August 2015, now 8 years postop with no evidence of disease.    Osteopenia of the spine, now considered normal.  She will continue oral calcium/vitamin D and Forteo injections.  We will continue bone density scans every 2 years.    3.  Degenerative disc disease and retropulsion causes high-grade spinal stenosis at L4-5.  L5-S1 right foraminal impingement.  Her symptoms are doing better.  Plan: Her mammogram from September 11 was clear and so I can see her back in 1 year with bilateral mammogram for repeat evaluation.  She would also be due for her next bone density scan at about that time.  She understands and agrees with this plan of care.   I provided 15 minutes of face-to-face time during this this encounter and > 50% was spent counseling as documented under my assessment and plan.  Derwood Kaplan, MD [facility] AT [facility] [address] [place] [address] Dept: [phone] Dept Fax: [phone]

## 2022-06-30 DIAGNOSIS — M810 Age-related osteoporosis without current pathological fracture: Secondary | ICD-10-CM | POA: Diagnosis not present

## 2022-07-07 DIAGNOSIS — M810 Age-related osteoporosis without current pathological fracture: Secondary | ICD-10-CM | POA: Diagnosis not present

## 2022-07-08 DIAGNOSIS — E559 Vitamin D deficiency, unspecified: Secondary | ICD-10-CM | POA: Diagnosis not present

## 2022-07-08 DIAGNOSIS — M8589 Other specified disorders of bone density and structure, multiple sites: Secondary | ICD-10-CM | POA: Diagnosis not present

## 2022-07-08 DIAGNOSIS — E785 Hyperlipidemia, unspecified: Secondary | ICD-10-CM | POA: Diagnosis not present

## 2022-07-08 DIAGNOSIS — Z853 Personal history of malignant neoplasm of breast: Secondary | ICD-10-CM | POA: Diagnosis not present

## 2022-07-08 LAB — COMPREHENSIVE METABOLIC PANEL
Albumin: 4.6 (ref 3.5–5.0)
Calcium: 9.8 (ref 8.7–10.7)
Globulin: 2.4
eGFR: 88

## 2022-07-08 LAB — CBC AND DIFFERENTIAL
HCT: 43 (ref 36–46)
Hemoglobin: 13.5 (ref 12.0–16.0)
Neutrophils Absolute: 5.6
Platelets: 177 10*3/uL (ref 150–400)
WBC: 8.9

## 2022-07-08 LAB — LIPID PANEL
Cholesterol: 136 (ref 0–200)
HDL: 48 (ref 35–70)
LDL Cholesterol: 65
LDl/HDL Ratio: 1.4
Triglycerides: 132 (ref 40–160)

## 2022-07-08 LAB — HEPATIC FUNCTION PANEL
ALT: 21 U/L (ref 7–35)
AST: 19 (ref 13–35)
Alkaline Phosphatase: 68 (ref 25–125)
Bilirubin, Total: 0.4

## 2022-07-08 LAB — BASIC METABOLIC PANEL
BUN: 18 (ref 4–21)
CO2: 25 — AB (ref 13–22)
Chloride: 102 (ref 99–108)
Creatinine: 0.7 (ref 0.5–1.1)
Glucose: 103
Potassium: 4.5 mEq/L (ref 3.5–5.1)
Sodium: 140 (ref 137–147)

## 2022-07-08 LAB — CBC: RBC: 4.61 (ref 3.87–5.11)

## 2022-07-12 DIAGNOSIS — R051 Acute cough: Secondary | ICD-10-CM | POA: Diagnosis not present

## 2022-07-12 DIAGNOSIS — J029 Acute pharyngitis, unspecified: Secondary | ICD-10-CM | POA: Diagnosis not present

## 2022-07-12 DIAGNOSIS — R509 Fever, unspecified: Secondary | ICD-10-CM | POA: Diagnosis not present

## 2022-07-12 DIAGNOSIS — R0981 Nasal congestion: Secondary | ICD-10-CM | POA: Diagnosis not present

## 2022-07-23 LAB — VITAMIN D 25 HYDROXY (VIT D DEFICIENCY, FRACTURES): Vitamin D, 25-Hydroxy: 43.9

## 2022-08-08 ENCOUNTER — Encounter: Payer: Self-pay | Admitting: Oncology

## 2022-10-27 DIAGNOSIS — K080 Exfoliation of teeth due to systemic causes: Secondary | ICD-10-CM | POA: Diagnosis not present

## 2022-12-15 DIAGNOSIS — K080 Exfoliation of teeth due to systemic causes: Secondary | ICD-10-CM | POA: Diagnosis not present

## 2023-01-01 DIAGNOSIS — M810 Age-related osteoporosis without current pathological fracture: Secondary | ICD-10-CM | POA: Diagnosis not present

## 2023-01-07 DIAGNOSIS — E785 Hyperlipidemia, unspecified: Secondary | ICD-10-CM | POA: Diagnosis not present

## 2023-01-07 DIAGNOSIS — Z853 Personal history of malignant neoplasm of breast: Secondary | ICD-10-CM | POA: Diagnosis not present

## 2023-01-07 DIAGNOSIS — K080 Exfoliation of teeth due to systemic causes: Secondary | ICD-10-CM | POA: Diagnosis not present

## 2023-01-07 DIAGNOSIS — E559 Vitamin D deficiency, unspecified: Secondary | ICD-10-CM | POA: Diagnosis not present

## 2023-01-07 DIAGNOSIS — M8589 Other specified disorders of bone density and structure, multiple sites: Secondary | ICD-10-CM | POA: Diagnosis not present

## 2023-01-08 DIAGNOSIS — M810 Age-related osteoporosis without current pathological fracture: Secondary | ICD-10-CM | POA: Diagnosis not present

## 2023-03-12 DIAGNOSIS — Z9181 History of falling: Secondary | ICD-10-CM | POA: Diagnosis not present

## 2023-03-12 DIAGNOSIS — Z1331 Encounter for screening for depression: Secondary | ICD-10-CM | POA: Diagnosis not present

## 2023-03-12 DIAGNOSIS — Z0000 Encounter for general adult medical examination without abnormal findings: Secondary | ICD-10-CM | POA: Diagnosis not present

## 2023-03-12 DIAGNOSIS — Z1231 Encounter for screening mammogram for malignant neoplasm of breast: Secondary | ICD-10-CM | POA: Diagnosis not present

## 2023-04-16 DIAGNOSIS — Z853 Personal history of malignant neoplasm of breast: Secondary | ICD-10-CM | POA: Diagnosis not present

## 2023-04-16 DIAGNOSIS — Z1231 Encounter for screening mammogram for malignant neoplasm of breast: Secondary | ICD-10-CM | POA: Diagnosis not present

## 2023-04-16 LAB — HM MAMMOGRAPHY

## 2023-04-20 ENCOUNTER — Ambulatory Visit: Payer: Medicare Other | Admitting: Oncology

## 2023-04-28 ENCOUNTER — Other Ambulatory Visit: Payer: Self-pay | Admitting: Hematology and Oncology

## 2023-04-28 DIAGNOSIS — R928 Other abnormal and inconclusive findings on diagnostic imaging of breast: Secondary | ICD-10-CM

## 2023-04-28 DIAGNOSIS — D0512 Intraductal carcinoma in situ of left breast: Secondary | ICD-10-CM

## 2023-04-29 ENCOUNTER — Telehealth: Payer: Self-pay | Admitting: Hematology and Oncology

## 2023-04-29 NOTE — Telephone Encounter (Signed)
04/29/23 Spoke with patient and scheduled Diagnostic mammogram and breast ultrasound.

## 2023-04-30 DIAGNOSIS — K080 Exfoliation of teeth due to systemic causes: Secondary | ICD-10-CM | POA: Diagnosis not present

## 2023-05-13 ENCOUNTER — Encounter: Payer: Self-pay | Admitting: Oncology

## 2023-05-14 DIAGNOSIS — M4850XA Collapsed vertebra, not elsewhere classified, site unspecified, initial encounter for fracture: Secondary | ICD-10-CM | POA: Diagnosis not present

## 2023-05-14 DIAGNOSIS — N959 Unspecified menopausal and perimenopausal disorder: Secondary | ICD-10-CM | POA: Diagnosis not present

## 2023-05-14 LAB — HM DEXA SCAN: HM Dexa Scan: NORMAL

## 2023-05-15 ENCOUNTER — Ambulatory Visit: Payer: Self-pay | Admitting: Oncology

## 2023-05-21 ENCOUNTER — Other Ambulatory Visit: Payer: Self-pay | Admitting: Hematology and Oncology

## 2023-05-21 ENCOUNTER — Telehealth: Payer: Self-pay

## 2023-05-21 DIAGNOSIS — R928 Other abnormal and inconclusive findings on diagnostic imaging of breast: Secondary | ICD-10-CM

## 2023-05-21 DIAGNOSIS — R92321 Mammographic fibroglandular density, right breast: Secondary | ICD-10-CM | POA: Diagnosis not present

## 2023-05-21 DIAGNOSIS — D0512 Intraductal carcinoma in situ of left breast: Secondary | ICD-10-CM | POA: Diagnosis not present

## 2023-05-21 NOTE — Telephone Encounter (Signed)
[facility] health needs an order for ultrasound guided breast bx for the right breast. They voiced this was on their schedule for Monday morning. Fax # [phone]

## 2023-05-25 DIAGNOSIS — N6312 Unspecified lump in the right breast, upper inner quadrant: Secondary | ICD-10-CM | POA: Diagnosis not present

## 2023-05-25 DIAGNOSIS — R928 Other abnormal and inconclusive findings on diagnostic imaging of breast: Secondary | ICD-10-CM | POA: Diagnosis not present

## 2023-05-25 DIAGNOSIS — D0511 Intraductal carcinoma in situ of right breast: Secondary | ICD-10-CM | POA: Diagnosis not present

## 2023-05-26 NOTE — Progress Notes (Signed)
[facility]  [address] [place],  [place]  [address] [phone]  Clinic Day:05/28/23   Referring physician: Paulina Fusi, MD  CHIEF COMPLAINT:  CC: History of stage 0 hormone receptor positive ductal carcinoma in situ of the left breast  Current Treatment:  Surveillance  HISTORY OF PRESENT ILLNESS:  Tracy Luna is a 77 y.o. female with a history of stage 0 (Tis N0 M0) hormone receptor positive ductal carcinoma in situ of the left breast cancer diagnosed in August 2015.  Core needle biopsy revealed intermediate grade ductal carcinoma in situ.  She was treated with lumpectomy.  Pathology revealed a residual less than 0.2 cm low-grade ductal carcinoma in situ.  Estrogen and progesterone receptors were positive.  She opted not to have radiotherapy.  We began seeing her in October 2015 and placed her on chemoprevention with raloxifene.  She has labs drawn regularly at Dr. Hoy Finlay office.  She states she is up-to-date on screening colonoscopy.  She had cataract surgery.  She completed 5 years of raloxifene in October 2020.  Bone density from August 2020 revealed osteopenia of the AP spine.She has had worsening back pain and MRI from August of 2023 revealed remote L5 compression fracture with progressive height loss since May 2022. Degenerative disease and retropulsion causes high-grade spinal stenosis at L4-5, and  L5-S1 right foraminal impingement.   INTERVAL HISTORY:  Tracy Luna is here for annual follow up for her history of stage 0 hormone receptor positive ductal carcinoma in situ of the left breast. Patient states that she feels well and only complains of chronic back pain. Tracy Luna had a screening bilateral mammogram done on 04/16/2023 that showed a possible asymmetry in her right breast and was recommended a diagnostic mammogram for further evaluation, which she had done on 05/21/2023. This revealed a suspicious 0.4 cm mass in the upper inner  quadrant of the right breast at 2 o'clock, 1cm from the nipple, and no pathologic right axillary lymphadenopathy. She then had this biopsied on 05/25/2023 and pathology revealed ductal carcinoma in situ focally involving intraductal papilloma, it is negative for invasive carcinoma, with micro-calcifications present within the DCIS. The DCIS measures 4 mm in the greater linear extent. I informed her that our next step of action would be to contact a Careers adviser. I will refer her to Dr. Georgiana Shore. She will not need to have a lymph node sampled due to it being a DCIS but she will need a lumpectomy, and chemoprevention is optional. I also informed her that she will probably not need radiation due to its size, if all margins are clear during surgery. Studies have shown that there is little to no difference if she received radiation or not while taking hormonal therapy in women over 70. She was previously on raloxifene and tolerated this well. I informed her about Tamoxifen, letrozole, Anastrozole and its potential side effects. She recently had a bone density scan done on 05/14/2023 and that was completely normal. This is not likely accurate since she has had compression fractures of her spine and has been on Forteo injections. She will now be switched to Prolia injection to help prevent worsening osteopenia. She inquired if she should had the COVID vaccine and I recommended that she get it but to wait until after her breast surgery. I will wait on prescribing her hormonal treatment until after her surgery and I will see her back next month to review the final pathology.   She denies signs of  infection such as sore throat, sinus drainage, cough, or urinary symptoms.  She denies fevers or recurrent chills. She denies pain. She denies nausea, vomiting, chest pain, dyspnea or cough. Her appetite is fine and her weight has been stable. She is accompanied by her daughter at today's visit.   REVIEW OF SYSTEMS:  Review of  Systems  Constitutional: Negative.  Negative for appetite change, chills, diaphoresis, fatigue, fever and unexpected weight change.  HENT:  Negative.  Negative for hearing loss, lump/mass, mouth sores, nosebleeds, sore throat, tinnitus, trouble swallowing and voice change.   Eyes: Negative.  Negative for eye problems and icterus.  Respiratory: Negative.  Negative for chest tightness, cough, hemoptysis, shortness of breath and wheezing.   Cardiovascular: Negative.  Negative for chest pain, leg swelling and palpitations.  Gastrointestinal: Negative.  Negative for abdominal distention, abdominal pain, blood in stool, constipation, diarrhea, nausea, rectal pain and vomiting.  Endocrine: Negative.   Genitourinary: Negative.  Negative for bladder incontinence, difficulty urinating, dyspareunia, dysuria, frequency, hematuria, menstrual problem, nocturia, pelvic pain, vaginal bleeding and vaginal discharge.   Musculoskeletal:  Positive for back pain. Negative for arthralgias, flank pain, gait problem, myalgias, neck pain and neck stiffness.  Skin: Negative.  Negative for itching, rash and wound.  Neurological: Negative.  Negative for dizziness, extremity weakness, gait problem, headaches, light-headedness, numbness, seizures and speech difficulty.  Hematological: Negative.  Negative for adenopathy. Does not bruise/bleed easily.  Psychiatric/Behavioral: Negative.  Negative for confusion, decreased concentration, depression, sleep disturbance and suicidal ideas. The patient is not nervous/anxious.      VITALS:  Blood pressure (!) 141/76, pulse 79, temperature 97.8 F (36.6 C), temperature source Oral, resp. rate 18, height 5\' 4"  (1.626 m), weight 212 lb 3.2 oz (96.3 kg), SpO2 98%.  Wt Readings from Last 3 Encounters:  05/28/23 212 lb 3.2 oz (96.3 kg)  04/16/22 212 lb 1.6 oz (96.2 kg)  05/22/21 211 lb (95.7 kg)    Body mass index is 36.42 kg/m.  Performance status (ECOG): 1 - Symptomatic but  completely ambulatory  PHYSICAL EXAM:  Physical Exam Vitals and nursing note reviewed. Exam conducted with a chaperone present.  Constitutional:      General: She is not in acute distress.    Appearance: Normal appearance. She is normal weight. She is not ill-appearing, toxic-appearing or diaphoretic.  HENT:     Head: Normocephalic and atraumatic.     Right Ear: Tympanic membrane, ear canal and external ear normal. There is no impacted cerumen.     Left Ear: Tympanic membrane, ear canal and external ear normal. There is no impacted cerumen.     Nose: Nose normal. No congestion or rhinorrhea.     Mouth/Throat:     Mouth: Mucous membranes are moist.     Pharynx: Oropharynx is clear. No oropharyngeal exudate or posterior oropharyngeal erythema.  Eyes:     General: No scleral icterus.       Right eye: No discharge.        Left eye: No discharge.     Extraocular Movements: Extraocular movements intact.     Conjunctiva/sclera: Conjunctivae normal.     Pupils: Pupils are equal, round, and reactive to light.  Neck:     Vascular: No carotid bruit.  Cardiovascular:     Rate and Rhythm: Normal rate and regular rhythm.     Pulses: Normal pulses.     Heart sounds: Normal heart sounds. No murmur heard.    No friction rub. No gallop.  Pulmonary:  Effort: Pulmonary effort is normal. No respiratory distress.     Breath sounds: Normal breath sounds. No stridor. No wheezing, rhonchi or rales.  Chest:     Chest wall: No tenderness.     Comments: Tiny indentation just medial to the nipple areolar complex at 9 o'clock with a tiny less than 1cm nodule in the left breast. The right breast has steri-strips at a biopsy site in the upper outer quadrant but no lesion is palpable.  Abdominal:     General: Bowel sounds are normal. There is no distension.     Palpations: Abdomen is soft. There is no hepatomegaly, splenomegaly or mass.     Tenderness: There is no abdominal tenderness. There is no right  CVA tenderness, left CVA tenderness, guarding or rebound.     Hernia: No hernia is present.  Musculoskeletal:        General: No swelling, tenderness, deformity or signs of injury. Normal range of motion.     Cervical back: Normal range of motion and neck supple. No rigidity or tenderness.     Right lower leg: No edema.     Left lower leg: No edema.  Lymphadenopathy:     Cervical: No cervical adenopathy.  Skin:    General: Skin is warm and dry.     Coloration: Skin is not jaundiced or pale.     Findings: No bruising, erythema, lesion or rash.  Neurological:     General: No focal deficit present.     Mental Status: She is alert and oriented to person, place, and time. Mental status is at baseline.     Cranial Nerves: No cranial nerve deficit.     Sensory: No sensory deficit.     Motor: No weakness.     Coordination: Coordination normal.     Gait: Gait normal.     Deep Tendon Reflexes: Reflexes normal.  Psychiatric:        Mood and Affect: Mood normal.        Behavior: Behavior normal.        Thought Content: Thought content normal.        Judgment: Judgment normal.    LABS:      Latest Ref Rng & Units 07/08/2022   12:00 AM 05/22/2021   12:56 PM 10/05/2018    5:41 AM  CBC  WBC  8.9     10.5  16.6   Hemoglobin 12.0 - 16.0 13.5     14.9  11.5   Hematocrit 36 - 46 43     45.9  36.0   Platelets 150 - 400 K/uL 177     286  187      This result is from an external source.      Latest Ref Rng & Units 07/08/2022   12:00 AM 05/22/2021   12:56 PM 10/05/2018    5:41 AM  CMP  Glucose 70 - 99 mg/dL  161  096   BUN 4 - 21 18     18  19    Creatinine 0.5 - 1.1 0.7     0.66  0.69   Sodium 137 - 147 140     141  133   Potassium 3.5 - 5.1 mEq/L 4.5     4.0  4.1   Chloride 99 - 108 102     107  104   CO2 13 - 22 25     25  22    Calcium 8.7 - 10.7 9.8  9.5  7.9   Alkaline Phos 25 - 125 68        AST 13 - 35 19        ALT 7 - 35 U/L 21           This result is from an external  source.     STUDIES:  Pathology: 05/25/2023 Ultrasound guided core needle biopsy of the indeterminate intraductal RIGHT breast mass Impression:  Breast, right, needle core biopsy, 2 o'clock, 1 cmfn (coil clip) Ductal, Carcinoma in Situ, Indeterminate nuclear grade, Cribriform and solid types without necrosis.  Focally involving intraductal papilloma Negative for invasive carcinoma Micro-calcification present within DCIS DCIS  measures 4mm in the greater linear extent.   Exam: 05/21/2023 Digital Diagnostic Unilateral Right Mammogram with Tomosynthesis and CAD; Ultrasound Right Breast.  Impression. Suspicious 0.4cm mass in the upper inner quadrant of the right breast at 2 o'clock 1cm from the nipple which accounts for the screening mammographic finding. No pathologic right axillary lymphadenopathy  Exam: 05/14/2023 Dual X-Ray Absorptiometry (DXA) for Bone Mineral Density Impression: AP Spine L1-L4 (L2) 05/14/2023  75.7  Normal  0.3  1.202g/cm2 DualFemur Neck Right 05/14/2023  75.7  Normal  0.3  0.997g/cm2 DualFemur TotalMEan 05/14/2023  75.7  Normal  0.5  1.064g/cm2    EXAM: 03/22/2021 DUAL X-RAY ABSORPTIOMETRY (DXA) FOR BONE MINERAL DENSITY  IMPRESSION: Tracy Luna completed a BMD test on 03/22/2021 using the Lunar iDXA DXA System (analysis version: 13.60) manufactured by [employer]. The following summarizes the results of our evaluation.  PATIENT BIOGRAPHICAL: Name: Tracy Luna, Tracy Luna Patient ID:  [id number] Birth Date: 02/03/1946 Height:     63.5 in. Gender: Female Exam Date: 03/22/2021 Weight: 203.2 lbs. Indications:  M85.89      Fractures:            Treatments:  ASSESSMENT: The BMD measured at Femur Neck Left is 0.983 g/cm2 with a T-score of -0.4. This patient is considered normal according to World Health Organization [facility]) criteria. The scan quality is good. Exclusions: L3 & L4 were excluded due to degenerative changes.  Site Region Measured Measured WHO  Young Adult BMD Date      Age      Classification T-score AP Spine L1-L2 5/22/2023 73.6 Normal -0.5 1.100 g/cm2 AP Spine L1-L2 03/16/2019 71.5 Osteopenia -1.1 1.039 g/cm2 AP Spine L1-L2 02/27/2016 68.5 Normal -0.8 1.072 g/cm2 AP Spine L1-L2 02/08/2014 66.4 Osteopenia -1.1 1.039 g/cm2  DualFemur Neck Left 03/22/2021 73.6 Normal -0.4 0.983 g/cm2 DualFemur Neck Left 03/16/2019 71.5 Normal -0.1 1.023 g/cm2 DualFemur Neck Left 02/27/2016 68.5 Normal 0.0 1.036 g/cm2 DualFemur Neck Left 02/08/2014 66.4 Normal -0.2 1.016 g/cm2  DualFemur Total Mean 03/22/2021 73.6 Normal 0.6 1.089 g/cm2 DualFemur Total Mean 03/16/2019 71.5 Normal 0.7 1.096 g/cm2 DualFemur Total Mean 02/27/2016 68.5 Normal 0.6 1.083 g/cm2 DualFemur Total Mean 02/08/2014 66.4 Normal 0.5 1.077 g/cm2  Allergies: No Known Allergies  Current Medications: Current Outpatient Medications  Medication Sig Dispense Refill   furosemide (LASIX) 20 MG tablet Take 20 mg by mouth daily.     Teriparatide (FORTEO [place]) Inject into the skin daily.     acetaminophen (TYLENOL) 500 MG tablet Take 1,000 mg by mouth every 6 (six) hours as needed.     atorvastatin (LIPITOR) 20 MG tablet Take 20 mg by mouth at bedtime.     Calcium Carb-Cholecalciferol (CALCIUM 600 + D PO) Take 1 tablet by mouth daily.     Carboxymethylcellul-Glycerin (LUBRICATING EYE DROPS OP) Place 1 drop into both eyes  daily as needed (dry/tired eyes).     Cholecalciferol (DIALYVITE VITAMIN D 5000) 125 MCG (5000 UT) capsule Take 5,000 Units by mouth daily.     METAMUCIL FIBER PO Take 1 Scoop by mouth daily.     No current facility-administered medications for this visit.     ASSESSMENT & PLAN:  Assessment:   Stage 0 left breast cancer diagnosed in August 2015, now 8 years postop with no evidence of disease.    Osteopenia of the spine, now considered normal.  She will continue oral calcium/vitamin D and switch to Prolia injections.  We will continue bone density scans every 2 years.    3.   Degenerative disc disease and retropulsion causes high-grade spinal stenosis at L4-5.  L5-S1 right foraminal impingement.  Her symptoms are doing better.  4. New stage 0 right breast cancer. She will be referred to the surgeon and then I will see her back post-op to discuss the final pathology. Fortunately this is ER and PR positive and she could consider hormonal therapy as chemoprevention, but we will need to weigh this against the risk for worsening bone density.   Plan: Tracy Luna had a screening bilateral mammogram done on 04/16/2023 that showed a possible asymmetry in her right breast and was recommended a diagnostic mammogram for further evaluation which she had done on 05/21/2023. This revealed a suspicious 0.4 cm mass in the upper inner quadrant of the right breast at 2 o'clock, 1cm from the nipple, and no pathologic right axillary lymphadenopathy. She then had this biopsied on 05/25/2023 and pathology revealed ductal carcinoma in situ focally involving intraductal papilloma, it is negative for invasive carcinoma with micro-calcifications present within the DCIS. The DCIS measures 4 mm in the greater linear extent. I informed her that our next step of action would be to contact a Careers adviser. I will refer her to Dr. Georgiana Shore. She will not need to have a lymph node sampled due to it being a DCIS but she will need a lumpectomy and chemoprevention is optional. I also informed her that she will probably not need radiation due to its size, if all margins are clear during surgery. Studies have shown that there is little to no difference if she received radiation or not while taking hormonal therapy in women over 70. She was previously on raloxifene and tolerated this well. I informed her about Tamoxifen, letrozole, Anastrozole and potential side effects. She recently had a bone density scan done on 05/14/2023 and that was completely normal. This is not likely accurate since she has had compression fractures of her  spine and has been on Forteo injections. She will now be switched to Prolia injection to help prevent worsening osteopenia. She inquired if she should had the COVID vaccine and I recommended that she get it but to wait until after her breast surgery. I will wait on prescribing her hormonal treatment until after her surgery and we will need to weigh the risks vs benefits in view of her bone density issues.  I will see her back next month to review the final pathology. She and her daughter understand and agree with this plan of care.  I provided 38 minutes of face-to-face time during this this encounter and > 50% was spent counseling as documented under my assessment and plan.    Dellia Beckwith, MD [facility] [address] [place] [address] Dept: [phone] Dept Fax: [phone]    Rulon Sera  Lassiter,acting as a scribe for Dellia Beckwith, MD.,have documented all relevant documentation on the behalf of Dellia Beckwith, MD,as directed by  Dellia Beckwith, MD while in the presence of Dellia Beckwith, MD.

## 2023-05-27 NOTE — Progress Notes (Signed)
Phone contact with newly diagnosed breast cancer pt. Pt has appt with Dr. Gilman Buttner tomorrow at 8:30. Pt would like to discuss options with Dr Gilman Buttner. before she decides on a Careers adviser. Will touch base with pt tomorrow after her appt with Dr. Gilman Buttner and will discuss surgical referral.

## 2023-05-28 ENCOUNTER — Inpatient Hospital Stay: Payer: Medicare Other | Attending: Oncology | Admitting: Oncology

## 2023-05-28 ENCOUNTER — Encounter: Payer: Self-pay | Admitting: Oncology

## 2023-05-28 VITALS — BP 141/76 | HR 79 | Temp 97.8°F | Resp 18 | Ht 64.0 in | Wt 212.2 lb

## 2023-05-28 DIAGNOSIS — D0511 Intraductal carcinoma in situ of right breast: Secondary | ICD-10-CM | POA: Diagnosis not present

## 2023-05-28 DIAGNOSIS — D0512 Intraductal carcinoma in situ of left breast: Secondary | ICD-10-CM

## 2023-05-28 DIAGNOSIS — Z86000 Personal history of in-situ neoplasm of breast: Secondary | ICD-10-CM | POA: Diagnosis not present

## 2023-05-28 DIAGNOSIS — Z79899 Other long term (current) drug therapy: Secondary | ICD-10-CM | POA: Insufficient documentation

## 2023-05-28 DIAGNOSIS — Z170 Estrogen receptor positive status [ER+]: Secondary | ICD-10-CM | POA: Diagnosis not present

## 2023-05-28 DIAGNOSIS — M8588 Other specified disorders of bone density and structure, other site: Secondary | ICD-10-CM | POA: Insufficient documentation

## 2023-05-28 DIAGNOSIS — C50211 Malignant neoplasm of upper-inner quadrant of right female breast: Secondary | ICD-10-CM | POA: Diagnosis not present

## 2023-05-28 NOTE — Progress Notes (Signed)
Face ot face contact with pt in [facility]. Pt is here to see Dr. Gilman Buttner and has decided to see Dr. Georgiana Shore for surgical consult. Appt made for 06/02/23 at 9:30. Pt aware of time and location.

## 2023-05-29 ENCOUNTER — Telehealth: Payer: Self-pay

## 2023-05-29 NOTE — Telephone Encounter (Signed)
-----   Message from Dellia Beckwith sent at 05/28/2023  7:40 PM EDT ----- Regarding: refer Pls ref Dr. Georgiana Shore for new stage 0 breast cancer

## 2023-06-02 DIAGNOSIS — D0511 Intraductal carcinoma in situ of right breast: Secondary | ICD-10-CM | POA: Diagnosis not present

## 2023-06-09 ENCOUNTER — Encounter: Payer: Self-pay | Admitting: Hematology and Oncology

## 2023-06-11 ENCOUNTER — Encounter: Payer: Self-pay | Admitting: Hematology and Oncology

## 2023-06-15 ENCOUNTER — Encounter: Payer: Self-pay | Admitting: Hematology and Oncology

## 2023-06-18 ENCOUNTER — Telehealth: Payer: Self-pay

## 2023-06-18 DIAGNOSIS — D0511 Intraductal carcinoma in situ of right breast: Secondary | ICD-10-CM | POA: Insufficient documentation

## 2023-06-18 NOTE — Telephone Encounter (Signed)
-----   Message from Dellia Beckwith sent at 06/18/2023  9:05 AM EST ----- Regarding: path Find out when Dr. Georgiana Shore does her breast surgery and get path report,  I will sched f/u in early Dec.

## 2023-06-18 NOTE — Telephone Encounter (Signed)
Surgery scheduled for 06/24/23. Will look out for pathology report.

## 2023-06-24 DIAGNOSIS — Z79899 Other long term (current) drug therapy: Secondary | ICD-10-CM | POA: Diagnosis not present

## 2023-06-24 DIAGNOSIS — M810 Age-related osteoporosis without current pathological fracture: Secondary | ICD-10-CM | POA: Diagnosis not present

## 2023-06-24 DIAGNOSIS — D0511 Intraductal carcinoma in situ of right breast: Secondary | ICD-10-CM | POA: Diagnosis not present

## 2023-06-24 DIAGNOSIS — E7800 Pure hypercholesterolemia, unspecified: Secondary | ICD-10-CM | POA: Diagnosis not present

## 2023-07-06 ENCOUNTER — Telehealth: Payer: Self-pay | Admitting: Oncology

## 2023-07-06 NOTE — Telephone Encounter (Signed)
Patient has been scheduled. Aware of appt date and time.    Scheduling Message Entered by Gery Pray H on 05/28/2023 at  7:41 PM Priority: Routine <No visit type provided>  Department: [facility]  Provider:  Scheduling Notes:  RT 4-6 weeks (after breast surgery done)

## 2023-07-07 DIAGNOSIS — M810 Age-related osteoporosis without current pathological fracture: Secondary | ICD-10-CM | POA: Diagnosis not present

## 2023-07-07 DIAGNOSIS — D0511 Intraductal carcinoma in situ of right breast: Secondary | ICD-10-CM | POA: Diagnosis not present

## 2023-07-07 DIAGNOSIS — Z09 Encounter for follow-up examination after completed treatment for conditions other than malignant neoplasm: Secondary | ICD-10-CM | POA: Diagnosis not present

## 2023-07-14 DIAGNOSIS — M810 Age-related osteoporosis without current pathological fracture: Secondary | ICD-10-CM | POA: Diagnosis not present

## 2023-07-15 DIAGNOSIS — E559 Vitamin D deficiency, unspecified: Secondary | ICD-10-CM | POA: Diagnosis not present

## 2023-07-15 DIAGNOSIS — D0511 Intraductal carcinoma in situ of right breast: Secondary | ICD-10-CM | POA: Diagnosis not present

## 2023-07-15 DIAGNOSIS — E785 Hyperlipidemia, unspecified: Secondary | ICD-10-CM | POA: Diagnosis not present

## 2023-07-15 DIAGNOSIS — M8589 Other specified disorders of bone density and structure, multiple sites: Secondary | ICD-10-CM | POA: Diagnosis not present

## 2023-07-16 ENCOUNTER — Telehealth: Payer: Self-pay

## 2023-07-16 NOTE — Telephone Encounter (Signed)
-----   Message from Dellia Beckwith sent at 07/16/2023 11:00 AM EST ----- Regarding: path Need path report from 11/20

## 2023-07-16 NOTE — Telephone Encounter (Signed)
Printed

## 2023-07-28 DIAGNOSIS — C50919 Malignant neoplasm of unspecified site of unspecified female breast: Secondary | ICD-10-CM | POA: Diagnosis not present

## 2023-07-30 NOTE — Progress Notes (Unsigned)
[facility]  [address] 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 female with a history of stage 0 (Tis N0 M0) hormone receptor positive ductal carcinoma in situ of the left breast cancer diagnosed in August 2015.  Core needle biopsy revealed intermediate grade ductal carcinoma in situ.  She was treated with lumpectomy.  Pathology revealed a residual less than 0.2 cm low-grade ductal carcinoma in situ.  Estrogen and progesterone receptors were positive.  She opted not to have radiotherapy.  We began seeing her in October 2015 and placed her on chemoprevention with raloxifene.  She has labs drawn regularly at Dr. Hoy Finlay office.  She states she is up-to-date on screening colonoscopy.  She had cataract surgery.  She completed 5 years of raloxifene in October 2020.  Bone density from August 2020 revealed osteopenia of the AP spine.She has had worsening back pain and MRI from August of 2023 revealed remote L5 compression fracture with progressive height loss since May 2022. Degenerative disease and retropulsion causes high-grade spinal stenosis at L4-5, and  L5-S1 right foraminal impingement.   INTERVAL HISTORY:  Tracy Luna is here for annual follow up for her history of stage 0 hormone receptor positive ductal carcinoma in situ of the left breast. Patient states that she feels well and only complains of chronic back pain. Tracy Luna had a screening bilateral mammogram done on 04/16/2023 that showed a possible asymmetry in her right breast and was recommended a diagnostic mammogram for further evaluation, which she had done on 05/21/2023. This revealed a suspicious 0.4 cm mass in the upper inner  quadrant of the right breast at 2 o'clock, 1cm from the nipple, and no pathologic right axillary lymphadenopathy. She then had this biopsied on 05/25/2023 and pathology revealed ductal carcinoma in situ focally involving intraductal papilloma, it is negative for invasive carcinoma, with micro-calcifications present within the DCIS. The DCIS measures 4 mm in the greater linear extent. I informed her that our next step of action would be to contact a Careers adviser. I will refer her to Dr. Georgiana Shore. She will not need to have a lymph node sampled due to it being a DCIS but she will need a lumpectomy, and chemoprevention is optional. I also informed her that she will probably not need radiation due to its size, if all margins are clear during surgery. Studies have shown that there is little to no difference if she received radiation or not while taking hormonal therapy in women over 70. She was previously on raloxifene and tolerated this well. I informed her about Tamoxifen, letrozole, Anastrozole and its potential side effects. She recently had a bone density scan done on 05/14/2023 and that was completely normal. This is not likely accurate since she has had compression fractures of her spine and has been on Forteo injections. She will now be switched to Prolia injection to help prevent worsening osteopenia. She inquired if she should had the COVID vaccine and I recommended that she get it but to wait until after her breast surgery. I will wait on prescribing her hormonal treatment until after her surgery and I will see her back next month to review the final pathology.   She denies signs of  infection such as sore throat, sinus drainage, cough, or urinary symptoms.  She denies fevers or recurrent chills. She denies pain. She denies nausea, vomiting, chest pain, dyspnea or cough. Her appetite is fine and her weight has been stable. She is accompanied by her daughter at today's visit.   REVIEW OF SYSTEMS:  Review of  Systems  Constitutional: Negative.  Negative for appetite change, chills, diaphoresis, fatigue, fever and unexpected weight change.  HENT:  Negative.  Negative for hearing loss, lump/mass, mouth sores, nosebleeds, sore throat, tinnitus, trouble swallowing and voice change.   Eyes: Negative.  Negative for eye problems and icterus.  Respiratory: Negative.  Negative for chest tightness, cough, hemoptysis, shortness of breath and wheezing.   Cardiovascular: Negative.  Negative for chest pain, leg swelling and palpitations.  Gastrointestinal: Negative.  Negative for abdominal distention, abdominal pain, blood in stool, constipation, diarrhea, nausea, rectal pain and vomiting.  Endocrine: Negative.   Genitourinary: Negative.  Negative for bladder incontinence, difficulty urinating, dyspareunia, dysuria, frequency, hematuria, menstrual problem, nocturia, pelvic pain, vaginal bleeding and vaginal discharge.   Musculoskeletal:  Positive for back pain. Negative for arthralgias, flank pain, gait problem, myalgias, neck pain and neck stiffness.  Skin: Negative.  Negative for itching, rash and wound.  Neurological: Negative.  Negative for dizziness, extremity weakness, gait problem, headaches, light-headedness, numbness, seizures and speech difficulty.  Hematological: Negative.  Negative for adenopathy. Does not bruise/bleed easily.  Psychiatric/Behavioral: Negative.  Negative for confusion, decreased concentration, depression, sleep disturbance and suicidal ideas. The patient is not nervous/anxious.      VITALS:  There were no vitals taken for this visit.  Wt Readings from Last 3 Encounters:  05/28/23 212 lb 3.2 oz (96.3 kg)  04/16/22 212 lb 1.6 oz (96.2 kg)  05/22/21 211 lb (95.7 kg)    There is no height or weight on file to calculate BMI.  Performance status (ECOG): 1 - Symptomatic but completely ambulatory  PHYSICAL EXAM:  Physical Exam Vitals and nursing note reviewed. Exam conducted with a  chaperone present.  Constitutional:      General: She is not in acute distress.    Appearance: Normal appearance. She is normal weight. She is not ill-appearing, toxic-appearing or diaphoretic.  HENT:     Head: Normocephalic and atraumatic.     Right Ear: Tympanic membrane, ear canal and external ear normal. There is no impacted cerumen.     Left Ear: Tympanic membrane, ear canal and external ear normal. There is no impacted cerumen.     Nose: Nose normal. No congestion or rhinorrhea.     Mouth/Throat:     Mouth: Mucous membranes are moist.     Pharynx: Oropharynx is clear. No oropharyngeal exudate or posterior oropharyngeal erythema.  Eyes:     General: No scleral icterus.       Right eye: No discharge.        Left eye: No discharge.     Extraocular Movements: Extraocular movements intact.     Conjunctiva/sclera: Conjunctivae normal.     Pupils: Pupils are equal, round, and reactive to light.  Neck:     Vascular: No carotid bruit.  Cardiovascular:     Rate and Rhythm: Normal rate and regular rhythm.     Pulses: Normal pulses.     Heart sounds: Normal heart sounds. No murmur heard.    No friction rub. No gallop.  Pulmonary:     Effort: Pulmonary effort is normal. No respiratory distress.     Breath sounds: Normal  breath sounds. No stridor. No wheezing, rhonchi or rales.  Chest:     Chest wall: No tenderness.     Comments: Tiny indentation just medial to the nipple areolar complex at 9 o'clock with a tiny less than 1cm nodule in the left breast. The right breast has steri-strips at a biopsy site in the upper outer quadrant but no lesion is palpable.  Abdominal:     General: Bowel sounds are normal. There is no distension.     Palpations: Abdomen is soft. There is no hepatomegaly, splenomegaly or mass.     Tenderness: There is no abdominal tenderness. There is no right CVA tenderness, left CVA tenderness, guarding or rebound.     Hernia: No hernia is present.  Musculoskeletal:         General: No swelling, tenderness, deformity or signs of injury. Normal range of motion.     Cervical back: Normal range of motion and neck supple. No rigidity or tenderness.     Right lower leg: No edema.     Left lower leg: No edema.  Lymphadenopathy:     Cervical: No cervical adenopathy.  Skin:    General: Skin is warm and dry.     Coloration: Skin is not jaundiced or pale.     Findings: No bruising, erythema, lesion or rash.  Neurological:     General: No focal deficit present.     Mental Status: She is alert and oriented to person, place, and time. Mental status is at baseline.     Cranial Nerves: No cranial nerve deficit.     Sensory: No sensory deficit.     Motor: No weakness.     Coordination: Coordination normal.     Gait: Gait normal.     Deep Tendon Reflexes: Reflexes normal.  Psychiatric:        Mood and Affect: Mood normal.        Behavior: Behavior normal.        Thought Content: Thought content normal.        Judgment: Judgment normal.    LABS:      Latest Ref Rng & Units 07/08/2022   12:00 AM 05/22/2021   12:56 PM 10/05/2018    5:41 AM  CBC  WBC  8.9     10.5  16.6   Hemoglobin 12.0 - 16.0 13.5     14.9  11.5   Hematocrit 36 - 46 43     45.9  36.0   Platelets 150 - 400 K/uL 177     286  187      This result is from an external source.      Latest Ref Rng & Units 07/08/2022   12:00 AM 05/22/2021   12:56 PM 10/05/2018    5:41 AM  CMP  Glucose 70 - 99 mg/dL  409  811   BUN 4 - 21 18     18  19    Creatinine 0.5 - 1.1 0.7     0.66  0.69   Sodium 137 - 147 140     141  133   Potassium 3.5 - 5.1 mEq/L 4.5     4.0  4.1   Chloride 99 - 108 102     107  104   CO2 13 - 22 25     25  22    Calcium 8.7 - 10.7 9.8     9.5  7.9   Alkaline Phos 25 - 125 68  AST 13 - 35 19        ALT 7 - 35 U/L 21           This result is from an external source.     STUDIES:  Pathology: 05/25/2023 Ultrasound guided core needle biopsy of the indeterminate intraductal  RIGHT breast mass Impression:  Breast, right, needle core biopsy, 2 o'clock, 1 cmfn (coil clip) Ductal, Carcinoma in Situ, Indeterminate nuclear grade, Cribriform and solid types without necrosis.  Focally involving intraductal papilloma Negative for invasive carcinoma Micro-calcification present within DCIS DCIS  measures 4mm in the greater linear extent.   Exam: 05/21/2023 Digital Diagnostic Unilateral Right Mammogram with Tomosynthesis and CAD; Ultrasound Right Breast.  Impression. Suspicious 0.4cm mass in the upper inner quadrant of the right breast at 2 o'clock 1cm from the nipple which accounts for the screening mammographic finding. No pathologic right axillary lymphadenopathy  Exam: 05/14/2023 Dual X-Ray Absorptiometry (DXA) for Bone Mineral Density Impression: AP Spine L1-L4 (L2) 05/14/2023  75.7  Normal  0.3  1.202g/cm2 DualFemur Neck Right 05/14/2023  75.7  Normal  0.3  0.997g/cm2 DualFemur TotalMEan 05/14/2023  75.7  Normal  0.5  1.064g/cm2    EXAM: 03/22/2021 DUAL X-RAY ABSORPTIOMETRY (DXA) FOR BONE MINERAL DENSITY  IMPRESSION: Tracy Luna completed a BMD test on 03/22/2021 using the Lunar iDXA DXA System (analysis version: 13.60) manufactured by [employer]. The following summarizes the results of our evaluation.  PATIENT BIOGRAPHICAL: Name: Tracy Luna, Tracy Luna Patient ID:  [id number] Birth Date: 29-Jun-1946 Height:     63.5 in. Gender: Female Exam Date: 03/22/2021 Weight: 203.2 lbs. Indications:  M85.89      Fractures:            Treatments:  ASSESSMENT: The BMD measured at Femur Neck Left is 0.983 g/cm2 with a T-score of -0.4. This patient is considered normal according to World Health Organization [facility]) criteria. The scan quality is good. Exclusions: L3 & L4 were excluded due to degenerative changes.  Site Region Measured Measured WHO Young Adult BMD Date      Age      Classification T-score AP Spine L1-L2 5/22/2023 73.6 Normal -0.5 1.100 g/cm2 AP  Spine L1-L2 03/16/2019 71.5 Osteopenia -1.1 1.039 g/cm2 AP Spine L1-L2 02/27/2016 68.5 Normal -0.8 1.072 g/cm2 AP Spine L1-L2 02/08/2014 66.4 Osteopenia -1.1 1.039 g/cm2  DualFemur Neck Left 03/22/2021 73.6 Normal -0.4 0.983 g/cm2 DualFemur Neck Left 03/16/2019 71.5 Normal -0.1 1.023 g/cm2 DualFemur Neck Left 02/27/2016 68.5 Normal 0.0 1.036 g/cm2 DualFemur Neck Left 02/08/2014 66.4 Normal -0.2 1.016 g/cm2  DualFemur Total Mean 03/22/2021 73.6 Normal 0.6 1.089 g/cm2 DualFemur Total Mean 03/16/2019 71.5 Normal 0.7 1.096 g/cm2 DualFemur Total Mean 02/27/2016 68.5 Normal 0.6 1.083 g/cm2 DualFemur Total Mean 02/08/2014 66.4 Normal 0.5 1.077 g/cm2  Allergies: No Known Allergies  Current Medications: Current Outpatient Medications  Medication Sig Dispense Refill   acetaminophen (TYLENOL) 500 MG tablet Take 1,000 mg by mouth every 6 (six) hours as needed.     atorvastatin (LIPITOR) 20 MG tablet Take 20 mg by mouth at bedtime.     Calcium Carb-Cholecalciferol (CALCIUM 600 + D PO) Take 1 tablet by mouth daily.     Carboxymethylcellul-Glycerin (LUBRICATING EYE DROPS OP) Place 1 drop into both eyes daily as needed (dry/tired eyes).     Cholecalciferol (DIALYVITE VITAMIN D 5000) 125 MCG (5000 UT) capsule Take 5,000 Units by mouth daily.     furosemide (LASIX) 20 MG tablet Take 20 mg by mouth daily.     METAMUCIL  FIBER PO Take 1 Scoop by mouth daily.     Teriparatide (FORTEO [place]) Inject into the skin daily.     No current facility-administered medications for this visit.     ASSESSMENT & PLAN:  Assessment:   Stage 0 left breast cancer diagnosed in August 2015, now 8 years postop with no evidence of disease.    Osteopenia of the spine, now considered normal.  She will continue oral calcium/vitamin D and switch to Prolia injections.  We will continue bone density scans every 2 years.    3.  Degenerative disc disease and retropulsion causes high-grade spinal stenosis at L4-5.  L5-S1 right foraminal  impingement.  Her symptoms are doing better.  4. New stage 0 right breast cancer. She will be referred to the surgeon and then I will see her back post-op to discuss the final pathology. Fortunately this is ER and PR positive and she could consider hormonal therapy as chemoprevention, but we will need to weigh this against the risk for worsening bone density.   Plan: Tracy Luna had a screening bilateral mammogram done on 04/16/2023 that showed a possible asymmetry in her right breast and was recommended a diagnostic mammogram for further evaluation which she had done on 05/21/2023. This revealed a suspicious 0.4 cm mass in the upper inner quadrant of the right breast at 2 o'clock, 1cm from the nipple, and no pathologic right axillary lymphadenopathy. She then had this biopsied on 05/25/2023 and pathology revealed ductal carcinoma in situ focally involving intraductal papilloma, it is negative for invasive carcinoma with micro-calcifications present within the DCIS. The DCIS measures 4 mm in the greater linear extent. I informed her that our next step of action would be to contact a Careers adviser. I will refer her to Dr. Georgiana Shore. She will not need to have a lymph node sampled due to it being a DCIS but she will need a lumpectomy and chemoprevention is optional. I also informed her that she will probably not need radiation due to its size, if all margins are clear during surgery. Studies have shown that there is little to no difference if she received radiation or not while taking hormonal therapy in women over 70. She was previously on raloxifene and tolerated this well. I informed her about Tamoxifen, letrozole, Anastrozole and potential side effects. She recently had a bone density scan done on 05/14/2023 and that was completely normal. This is not likely accurate since she has had compression fractures of her spine and has been on Forteo injections. She will now be switched to Prolia injection to help prevent worsening  osteopenia. She inquired if she should had the COVID vaccine and I recommended that she get it but to wait until after her breast surgery. I will wait on prescribing her hormonal treatment until after her surgery and we will need to weigh the risks vs benefits in view of her bone density issues.  I will see her back next month to review the final pathology. She and her daughter understand and agree with this plan of care.  I provided 38 minutes of face-to-face time during this this encounter and > 50% was spent counseling as documented under my assessment and plan.    Dellia Beckwith, MD [facility] [address] [place] [address] Dept: [phone] Dept Fax: [phone]    Rulon Sera Lassiter,acting as a scribe for Dellia Beckwith, MD.,have documented all relevant documentation on the behalf of Tracy Luna  Holland Falling, MD,as directed by  Dellia Beckwith, MD while in the presence of Dellia Beckwith, MD.

## 2023-07-31 ENCOUNTER — Inpatient Hospital Stay: Payer: Medicare Other | Attending: Oncology | Admitting: Oncology

## 2023-07-31 ENCOUNTER — Encounter: Payer: Self-pay | Admitting: Oncology

## 2023-07-31 VITALS — BP 145/77 | HR 62 | Temp 98.2°F | Resp 16 | Ht 64.0 in | Wt 214.5 lb

## 2023-07-31 DIAGNOSIS — D0511 Intraductal carcinoma in situ of right breast: Secondary | ICD-10-CM | POA: Diagnosis not present

## 2023-07-31 DIAGNOSIS — M8588 Other specified disorders of bone density and structure, other site: Secondary | ICD-10-CM | POA: Insufficient documentation

## 2023-07-31 DIAGNOSIS — Z79899 Other long term (current) drug therapy: Secondary | ICD-10-CM | POA: Diagnosis not present

## 2023-07-31 DIAGNOSIS — D0512 Intraductal carcinoma in situ of left breast: Secondary | ICD-10-CM | POA: Diagnosis not present

## 2023-07-31 DIAGNOSIS — M549 Dorsalgia, unspecified: Secondary | ICD-10-CM | POA: Insufficient documentation

## 2023-07-31 DIAGNOSIS — Z853 Personal history of malignant neoplasm of breast: Secondary | ICD-10-CM | POA: Insufficient documentation

## 2023-07-31 DIAGNOSIS — G8929 Other chronic pain: Secondary | ICD-10-CM | POA: Insufficient documentation

## 2023-09-10 DIAGNOSIS — M810 Age-related osteoporosis without current pathological fracture: Secondary | ICD-10-CM | POA: Diagnosis not present

## 2023-10-29 DIAGNOSIS — K080 Exfoliation of teeth due to systemic causes: Secondary | ICD-10-CM | POA: Diagnosis not present

## 2023-11-25 NOTE — Progress Notes (Signed)
 [facility]  [address] [place],  [place]  [address] [phone]  Clinic Day: 11/26/23  Referring physician: Adrian Hopper, MD   CHIEF COMPLAINT:  CC: History of stage 0 hormone receptor positive DCIS of the left breast 2015, DCIS HR positive right breast 2024  Current Treatment:  Surveillance  HISTORY OF PRESENT ILLNESS:  Tracy Luna is a 78 y.o. female with a history of stage 0 (Tis N0 M0) hormone receptor positive ductal carcinoma in situ of the left breast cancer diagnosed in August 2015.  Core needle biopsy revealed intermediate grade ductal carcinoma in situ.  She was treated with lumpectomy.  Pathology revealed a residual less than 0.2 cm low-grade ductal carcinoma in situ.  Estrogen and progesterone receptors were positive.  She opted not to have radiotherapy.  We began seeing her in October 2015 and placed her on chemoprevention with raloxifene .  She has labs drawn regularly at Dr. Vianne Grad office.  She states she is up-to-date on screening colonoscopy.  She had cataract surgery.  She completed 5 years of raloxifene  in October 2020.  Bone density from August 2020 revealed osteopenia of the AP spine.She has had worsening back pain and MRI from August of 2023 revealed remote L5 compression fracture with progressive height loss since May 2022. Degenerative disease and retropulsion causes high-grade spinal stenosis at L4-5, and  L5-S1 right foraminal impingement.   Tracy Luna had a screening bilateral mammogram done on 04/16/2023 that showed a possible asymmetry in her right breast and was recommended a diagnostic mammogram for further evaluation, which she had done on 05/21/2023. This revealed a suspicious 0.4 cm mass in the upper inner quadrant of the right breast at 2 o'clock, 1cm from the nipple, and no pathologic right axillary lymphadenopathy. She then had this biopsied on 05/25/2023 and pathology revealed ductal carcinoma in situ focally involving an  intraductal papilloma, it is negative for invasive carcinoma, with micro-calcifications present within the DCIS. I referred her to Dr. Jonita Neth, and she had a right lumpectomy performed on 06/24/23. Final pathology confirmed DCIS only with no invasive component, measuring 16 mm, with intermediate nuclear grade and cribiform and solid papillary patterns. She had clear margins and fibrocystic changes.. She did not need to have a lymph node sampled and chemoprevention is optional. I also informed her that she will probably not need radiation due to its size and clear margins. She already had raloxifene  after her first DCIS in 2015 so I doubt there would be much benefit to it now.  I informed her about Tamoxifen, letrozole, Anastrozole and their potential side effects. She recently had a bone density scan done on 05/14/2023 and that was completely normal. This is not likely accurate since she has had compression fractures of her spine and has been diagnosed with osteoporosis. She has been on Forteo injections and will now be switched to Prolia  injections to help prevent worsening osteopenia. She is healing well from the surgery and I feel the risk outweighs the benefit for hormonal therapy at this time. I will plan routine follow up and schedule her annual mammograms so she prefers not to follow with the surgeon as well.   INTERVAL HISTORY:  Tracy Luna is here for annual follow up for her history of stage 0 hormone receptor positive ductal carcinoma in situ of the left breast in August of 2015. She had another DCIS discovered in October, 2024 and we discussed possible hormonal therapy but felt the risks outweighed the benefit, she had already  had Raloxifene  for chemoprevention after her first stage 0 breast cancer. She had a right lumpectomy performed on 06/24/23. She also had a completely normal bone density scan but clearly has osteoporosis clinically. Patient states that she feels well and has no complaints of pain. Her  follow-up appointment with Dr. Jonita Neth is this September. She inquired if she has to continue to see Dr. Adella Honey as he and I do they same check up. I explained that I can take over follow-ups and scheduling mammograms. I will schedule her next mammogram for September. She continues her Prolia  injection every 6 months. I will see her back in 4-5 months with bilateral diagnostic mammogram 1 week before her appointment with me. She denies fever, chills, night sweats, or other signs of infection. She denies cardiorespiratory and gastrointestinal issues. She denies pain. Her appetite is good and her weight has decreased 6 pounds over last 4 months . She is accompanied by her daughter at today's visit.   REVIEW OF SYSTEMS:  Review of Systems  Constitutional: Negative.  Negative for appetite change, chills, diaphoresis, fatigue, fever and unexpected weight change.  HENT:  Negative.  Negative for hearing loss, lump/mass, mouth sores, nosebleeds, sore throat, tinnitus, trouble swallowing and voice change.   Eyes: Negative.  Negative for eye problems and icterus.  Respiratory: Negative.  Negative for chest tightness, cough, hemoptysis, shortness of breath and wheezing.   Cardiovascular: Negative.  Negative for chest pain, leg swelling and palpitations.  Gastrointestinal: Negative.  Negative for abdominal distention, abdominal pain, blood in stool, constipation, diarrhea, nausea, rectal pain and vomiting.  Endocrine: Negative.   Genitourinary: Negative.  Negative for bladder incontinence, difficulty urinating, dyspareunia, dysuria, frequency, hematuria, menstrual problem, nocturia, pelvic pain, vaginal bleeding and vaginal discharge.   Musculoskeletal:  Positive for back pain. Negative for arthralgias, flank pain, gait problem, myalgias, neck pain and neck stiffness.  Skin: Negative.  Negative for itching, rash and wound.  Neurological: Negative.  Negative for dizziness, extremity weakness, gait problem,  headaches, light-headedness, numbness, seizures and speech difficulty.  Hematological: Negative.  Negative for adenopathy. Does not bruise/bleed easily.  Psychiatric/Behavioral: Negative.  Negative for confusion, decreased concentration, depression, sleep disturbance and suicidal ideas. The patient is not nervous/anxious.     VITALS:  Blood pressure (!) 159/70, pulse 68, temperature 97.6 F (36.4 C), temperature source Oral, resp. rate 18, height 5\' 4"  (1.626 m), weight 208 lb 12.8 oz (94.7 kg), SpO2 98%.  Wt Readings from Last 3 Encounters:  11/26/23 208 lb 12.8 oz (94.7 kg)  07/31/23 214 lb 8 oz (97.3 kg)  05/28/23 212 lb 3.2 oz (96.3 kg)    Body mass index is 35.84 kg/m.  Performance status (ECOG): 1 - Symptomatic but completely ambulatory  PHYSICAL EXAM:  Physical Exam Vitals and nursing note reviewed. Exam conducted with a chaperone present.  Constitutional:      General: She is not in acute distress.    Appearance: Normal appearance. She is normal weight. She is not ill-appearing, toxic-appearing or diaphoretic.  HENT:     Head: Normocephalic and atraumatic.     Right Ear: Tympanic membrane, ear canal and external ear normal. There is no impacted cerumen.     Left Ear: Tympanic membrane, ear canal and external ear normal. There is no impacted cerumen.     Nose: Nose normal. No congestion or rhinorrhea.     Mouth/Throat:     Mouth: Mucous membranes are moist.     Pharynx: Oropharynx is clear. No oropharyngeal exudate or  posterior oropharyngeal erythema.  Eyes:     General: No scleral icterus.       Right eye: No discharge.        Left eye: No discharge.     Extraocular Movements: Extraocular movements intact.     Conjunctiva/sclera: Conjunctivae normal.     Pupils: Pupils are equal, round, and reactive to light.  Neck:     Vascular: No carotid bruit.  Cardiovascular:     Rate and Rhythm: Normal rate and regular rhythm.     Pulses: Normal pulses.     Heart sounds:  Normal heart sounds. No murmur heard.    No friction rub. No gallop.  Pulmonary:     Effort: Pulmonary effort is normal. No respiratory distress.     Breath sounds: Normal breath sounds. No stridor. No wheezing, rhonchi or rales.  Chest:     Chest wall: No tenderness.     Comments: Well healed scar in the upper inner quadrant of the right breast No masses in either breasts Abdominal:     General: Bowel sounds are normal. There is no distension.     Palpations: Abdomen is soft. There is no hepatomegaly, splenomegaly or mass.     Tenderness: There is no abdominal tenderness. There is no right CVA tenderness, left CVA tenderness, guarding or rebound.     Hernia: No hernia is present.  Musculoskeletal:        General: No swelling, tenderness, deformity or signs of injury. Normal range of motion.     Cervical back: Normal range of motion and neck supple. No rigidity or tenderness.     Right lower leg: No edema.     Left lower leg: No edema.  Lymphadenopathy:     Cervical: No cervical adenopathy.  Skin:    General: Skin is warm and dry.     Coloration: Skin is not jaundiced or pale.     Findings: No bruising, erythema, lesion or rash.  Neurological:     General: No focal deficit present.     Mental Status: She is alert and oriented to person, place, and time. Mental status is at baseline.     Cranial Nerves: No cranial nerve deficit.     Sensory: No sensory deficit.     Motor: No weakness.     Coordination: Coordination normal.     Gait: Gait normal.     Deep Tendon Reflexes: Reflexes normal.  Psychiatric:        Mood and Affect: Mood normal.        Behavior: Behavior normal.        Thought Content: Thought content normal.        Judgment: Judgment normal.    LABS:      Latest Ref Rng & Units 07/08/2022   12:00 AM 05/22/2021   12:56 PM 10/05/2018    5:41 AM  CBC  WBC  8.9     10.5  16.6   Hemoglobin 12.0 - 16.0 13.5     14.9  11.5   Hematocrit 36 - 46 43     45.9  36.0    Platelets 150 - 400 K/uL 177     286  187      This result is from an external source.      Latest Ref Rng & Units 07/08/2022   12:00 AM 05/22/2021   12:56 PM 10/05/2018    5:41 AM  CMP  Glucose 70 - 99 mg/dL  409  811  BUN 4 - 21 18     18  19    Creatinine 0.5 - 1.1 0.7     0.66  0.69   Sodium 137 - 147 140     141  133   Potassium 3.5 - 5.1 mEq/L 4.5     4.0  4.1   Chloride 99 - 108 102     107  104   CO2 13 - 22 25     25  22    Calcium  8.7 - 10.7 9.8     9.5  7.9   Alkaline Phos 25 - 125 68        AST 13 - 35 19        ALT 7 - 35 U/L 21           This result is from an external source.   STUDIES:  Exam: 05/14/2023 Dual X-Ray Absorptiometry (DXA) for Bone Mineral Density Impression: AP Spine L1-L4 (L2) 05/14/2023  75.7  Normal  0.3  1.202g/cm2 DualFemur Neck Right 05/14/2023  75.7  Normal  0.3  0.997g/cm2 DualFemur TotalMEan 05/14/2023  75.7  Normal  0.5  1.064g/cm2    ALLERGIES:  No known allergies.  Current Medications: Current Outpatient Medications  Medication Sig Dispense Refill   denosumab  (PROLIA ) 60 MG/ML SOSY injection Inject 60 mg into the skin every 6 (six) months.     acetaminophen  (TYLENOL ) 500 MG tablet Take 1,000 mg by mouth every 6 (six) hours as needed.     atorvastatin  (LIPITOR) 20 MG tablet Take 20 mg by mouth at bedtime.     Calcium  Carb-Cholecalciferol (CALCIUM  600 + D PO) Take 1 tablet by mouth daily.     Carboxymethylcellul-Glycerin (LUBRICATING EYE DROPS OP) Place 1 drop into both eyes daily as needed (dry/tired eyes).     Cholecalciferol (DIALYVITE VITAMIN D  5000) 125 MCG (5000 UT) capsule Take 5,000 Units by mouth daily.     furosemide (LASIX) 20 MG tablet Take 20 mg by mouth daily.     METAMUCIL FIBER PO Take 1 Scoop by mouth daily.     No current facility-administered medications for this visit.   ASSESSMENT & PLAN:  Assessment:   Stage 0 left breast cancer diagnosed in August 2015, now 8 years postop with no evidence of disease.     Osteopenia of the spine, now considered normal on bone density even though clinically abnormal.  She will continue oral calcium /vitamin D  and Prolia  injections.  We will continue bone density scans every 2 years.    3.  Degenerative disc disease and retropulsion causing high-grade spinal stenosis at L4-5.  L5-S1 right foraminal impingement.  Her symptoms are doing better.  4. New stage 0 right breast cancer, measuring at least 16 mm, ER and PR positive and treated with lumpectomy. I do not recommend radiation in view of her age, small size and clear margins. We discussed hormonal therapy as chemoprevention, but felt the risk outweighed the benefit in view of her worsening bone density, so we will not pursue this.  Plan: She had another DCIS discovered in October, 2024 and we discussed possible hormonal therapy but felt the risks outweighed the benefit, she had already had Raloxifene  for chemoprevention after her first stage 0 breast cancer. She had a right lumpectomy performed on 06/24/23. Her follow-up appointment with Dr. Jonita Neth is this September. She inquired if she has to continue to see Dr. Adella Honey as he and I do they same check up. I explained that I can  take over follow-ups and scheduling mammograms. I will schedule her next mammogram for September. She continues her Prolia  injection every 6 months. I will see her back in 4-5 months with bilateral diagnostic mammogram 1 week before her appointment with me.   She and her daughter understand and agree with this plan of care.  I provided 38 minutes of face-to-face time during this this encounter and > 50% was spent counseling as documented under my assessment and plan.   Nolia Baumgartner, MD  [facility] - [facility] DE[facility] OF [facility] [address] [place] [address] Dept: [phone] Dept Fax: [phone]   No orders of the defined types were placed in this  encounter.   I,Jasmine M Lassiter,acting as a scribe for Nolia Baumgartner, MD.,have documented all relevant documentation on the behalf of Nolia Baumgartner, MD,as directed by  Nolia Baumgartner, MD while in the presence of Nolia Baumgartner, MD.

## 2023-11-26 ENCOUNTER — Inpatient Hospital Stay: Payer: Medicare Other | Attending: Oncology | Admitting: Oncology

## 2023-11-26 ENCOUNTER — Encounter: Payer: Self-pay | Admitting: Oncology

## 2023-11-26 ENCOUNTER — Telehealth: Payer: Self-pay | Admitting: Oncology

## 2023-11-26 ENCOUNTER — Other Ambulatory Visit: Payer: Self-pay | Admitting: Oncology

## 2023-11-26 VITALS — BP 159/70 | HR 68 | Temp 97.6°F | Resp 18 | Ht 64.0 in | Wt 208.8 lb

## 2023-11-26 DIAGNOSIS — Z170 Estrogen receptor positive status [ER+]: Secondary | ICD-10-CM | POA: Insufficient documentation

## 2023-11-26 DIAGNOSIS — D0512 Intraductal carcinoma in situ of left breast: Secondary | ICD-10-CM

## 2023-11-26 DIAGNOSIS — Z79899 Other long term (current) drug therapy: Secondary | ICD-10-CM | POA: Diagnosis not present

## 2023-11-26 DIAGNOSIS — Z1721 Progesterone receptor positive status: Secondary | ICD-10-CM | POA: Insufficient documentation

## 2023-11-26 DIAGNOSIS — M8588 Other specified disorders of bone density and structure, other site: Secondary | ICD-10-CM | POA: Diagnosis not present

## 2023-11-26 DIAGNOSIS — Z86000 Personal history of in-situ neoplasm of breast: Secondary | ICD-10-CM | POA: Insufficient documentation

## 2023-11-26 DIAGNOSIS — D0511 Intraductal carcinoma in situ of right breast: Secondary | ICD-10-CM | POA: Diagnosis not present

## 2023-11-26 NOTE — Telephone Encounter (Signed)
 Patient has been scheduled for follow-up visit per 11/24/23 LOS.  Pt given an appt calendar with date and time.

## 2024-01-13 DIAGNOSIS — D0511 Intraductal carcinoma in situ of right breast: Secondary | ICD-10-CM | POA: Diagnosis not present

## 2024-01-13 DIAGNOSIS — E559 Vitamin D deficiency, unspecified: Secondary | ICD-10-CM | POA: Diagnosis not present

## 2024-01-13 DIAGNOSIS — M8589 Other specified disorders of bone density and structure, multiple sites: Secondary | ICD-10-CM | POA: Diagnosis not present

## 2024-01-13 DIAGNOSIS — E785 Hyperlipidemia, unspecified: Secondary | ICD-10-CM | POA: Diagnosis not present

## 2024-04-20 LAB — HM MAMMOGRAPHY

## 2024-04-27 ENCOUNTER — Ambulatory Visit: Admitting: Oncology

## 2024-05-26 ENCOUNTER — Ambulatory Visit: Admitting: Oncology

## 2024-05-31 ENCOUNTER — Telehealth: Payer: Self-pay | Admitting: Oncology

## 2024-05-31 ENCOUNTER — Inpatient Hospital Stay: Attending: Oncology | Admitting: Oncology

## 2024-05-31 ENCOUNTER — Other Ambulatory Visit: Payer: Self-pay | Admitting: Oncology

## 2024-05-31 VITALS — BP 140/68 | HR 67 | Temp 98.0°F | Resp 16 | Ht 64.0 in | Wt 205.2 lb

## 2024-05-31 DIAGNOSIS — D0512 Intraductal carcinoma in situ of left breast: Secondary | ICD-10-CM

## 2024-05-31 DIAGNOSIS — M8588 Other specified disorders of bone density and structure, other site: Secondary | ICD-10-CM | POA: Insufficient documentation

## 2024-05-31 DIAGNOSIS — Z79899 Other long term (current) drug therapy: Secondary | ICD-10-CM | POA: Diagnosis not present

## 2024-05-31 DIAGNOSIS — D0511 Intraductal carcinoma in situ of right breast: Secondary | ICD-10-CM | POA: Insufficient documentation

## 2024-05-31 DIAGNOSIS — Z86000 Personal history of in-situ neoplasm of breast: Secondary | ICD-10-CM | POA: Diagnosis not present

## 2024-05-31 DIAGNOSIS — Z170 Estrogen receptor positive status [ER+]: Secondary | ICD-10-CM | POA: Insufficient documentation

## 2024-05-31 DIAGNOSIS — M48061 Spinal stenosis, lumbar region without neurogenic claudication: Secondary | ICD-10-CM | POA: Diagnosis not present

## 2024-05-31 NOTE — Telephone Encounter (Signed)
 Patient has been scheduled for follow-up visit per 05/31/24 LOS.  Pt given an appt calendar with date and time.

## 2024-05-31 NOTE — Progress Notes (Signed)
 [facility]  [address] [place],  [place]  [address] [phone]  Clinic Day: 05/31/24  Referring physician: Keren Vicenta BRAVO, MD   CHIEF COMPLAINT:  CC: History of stage 0 hormone receptor positive DCIS of the left breast 2015, DCIS HR positive right breast 2024  Current Treatment:  Surveillance  HISTORY OF PRESENT ILLNESS:  Tracy Luna is a 78 y.o. female with a history of stage 0 (Tis N0 M0) hormone receptor positive ductal carcinoma in situ of the left breast cancer diagnosed in August 2015.  Core needle biopsy revealed intermediate grade ductal carcinoma in situ.  She was treated with lumpectomy.  Pathology revealed a residual less than 0.2 cm low-grade ductal carcinoma in situ.  Estrogen and progesterone receptors were positive.  She opted not to have radiotherapy.  We began seeing her in October 2015 and placed her on chemoprevention with raloxifene .  She has labs drawn regularly at Dr. Margarete office.  She states she is up-to-date on screening colonoscopy.  She had cataract surgery.  She completed 5 years of raloxifene  in October 2020.  Bone density from August 2020 revealed osteopenia of the AP spine.She has had worsening back pain and MRI from August of 2023 revealed remote L5 compression fracture with progressive height loss since May 2022. Degenerative disease and retropulsion causes high-grade spinal stenosis at L4-5, and  L5-S1 right foraminal impingement.   Tracy Luna had a screening bilateral mammogram done on 04/16/2023 that showed a possible asymmetry in her right breast and was recommended a diagnostic mammogram for further evaluation, which she had done on 05/21/2023. This revealed a suspicious 0.4 cm mass in the upper inner quadrant of the right breast at 2 o'clock, 1cm from the nipple, and no pathologic right axillary lymphadenopathy. She then had this biopsied on 05/25/2023 and pathology revealed ductal carcinoma in situ focally involving an  intraductal papilloma, it is negative for invasive carcinoma, with micro-calcifications present within the DCIS. I referred her to Dr. Bert, and she had a right lumpectomy performed on 06/24/23. Final pathology confirmed DCIS only with no invasive component, measuring 16 mm, with intermediate nuclear grade and cribiform and solid papillary patterns. She had clear margins and fibrocystic changes.. She did not need to have a lymph node sampled and chemoprevention is optional. I also informed her that she will probably not need radiation due to its size and clear margins. She already had raloxifene  after her first DCIS in 2015 so I doubt there would be much benefit to it now.  I informed her about Tamoxifen, letrozole, Anastrozole and their potential side effects. She recently had a bone density scan done on 05/14/2023 and that was completely normal. This is not likely accurate since she has had compression fractures of her spine and has been diagnosed with osteoporosis. She has been on Forteo injections and will now be switched to Prolia  injections to help prevent worsening osteopenia. She is healing well from the surgery and I feel the risk outweighs the benefit for hormonal therapy at this time. I will plan routine follow up and schedule her annual mammograms so she prefers not to follow with the surgeon as well.   INTERVAL HISTORY:  Tracy Luna is here for annual follow up for her history of stage 0 hormone receptor positive ductal carcinoma in situ of the left breast in August of 2015. She had another DCIS of the right breast discovered in October, 2024 and we discussed possible hormonal therapy but felt the risks outweighed the  benefit, she had already had Raloxifene  for chemoprevention after her first stage 0 breast cancer. She has severe osteoporosis, although this is not detectable on her DEXA scan.  She had a right lumpectomy performed on 06/24/23. Patient states that she feels well and has no current  complaints of pain. She had a diagnostic bilateral mammogram done on 04/20/2024 that was clear. She has decided to switch and have her mammograms done here. She informed me that she had an appointment with her orthopedist due to increasing back pain and she was placed on Prednisone. Her PCP continues to do routine labs and she continues her Prolia  injection every 6 months. I will see her back in 1 year with screening bilateral mammogram. She denies fever, chills, night sweats, or other signs of infection. She denies cardiorespiratory and gastrointestinal issues. She  denies pain. Her appetite is good and Her weight has decreased 3 pounds over last 6 months. This patient is accompanied in the office by her daughter.    REVIEW OF SYSTEMS:  Review of Systems  Constitutional: Negative.  Negative for appetite change, chills, diaphoresis, fatigue, fever and unexpected weight change.  HENT:  Negative.  Negative for hearing loss, lump/mass, mouth sores, nosebleeds, sore throat, tinnitus, trouble swallowing and voice change.   Eyes: Negative.  Negative for eye problems and icterus.  Respiratory: Negative.  Negative for chest tightness, cough, hemoptysis, shortness of breath and wheezing.   Cardiovascular: Negative.  Negative for chest pain, leg swelling and palpitations.  Gastrointestinal: Negative.  Negative for abdominal distention, abdominal pain, blood in stool, constipation, diarrhea, nausea, rectal pain and vomiting.  Endocrine: Negative.   Genitourinary: Negative.  Negative for bladder incontinence, difficulty urinating, dyspareunia, dysuria, frequency, hematuria, menstrual problem, nocturia, pelvic pain, vaginal bleeding and vaginal discharge.   Musculoskeletal:  Positive for back pain. Negative for arthralgias, flank pain, gait problem, myalgias, neck pain and neck stiffness.  Skin: Negative.  Negative for itching, rash and wound.  Neurological: Negative.  Negative for dizziness, extremity weakness,  gait problem, headaches, light-headedness, numbness, seizures and speech difficulty.  Hematological: Negative.  Negative for adenopathy. Does not bruise/bleed easily.  Psychiatric/Behavioral: Negative.  Negative for confusion, decreased concentration, depression, sleep disturbance and suicidal ideas. The patient is not nervous/anxious.     VITALS:  Blood pressure (!) 140/68, pulse 67, temperature 98 F (36.7 C), temperature source Oral, resp. rate 16, height 5' 4 (1.626 m), weight 205 lb 3.2 oz (93.1 kg), SpO2 98%.  Wt Readings from Last 3 Encounters:  05/31/24 205 lb 3.2 oz (93.1 kg)  11/26/23 208 lb 12.8 oz (94.7 kg)  07/31/23 214 lb 8 oz (97.3 kg)    Body mass index is 35.22 kg/m.  Performance status (ECOG): 1 - Symptomatic but completely ambulatory  PHYSICAL EXAM:  Physical Exam Vitals and nursing note reviewed. Exam conducted with a chaperone present.  Constitutional:      General: She is not in acute distress.    Appearance: Normal appearance. She is normal weight. She is not ill-appearing, toxic-appearing or diaphoretic.  HENT:     Head: Normocephalic and atraumatic.     Right Ear: Tympanic membrane, ear canal and external ear normal. There is no impacted cerumen.     Left Ear: Tympanic membrane, ear canal and external ear normal. There is no impacted cerumen.     Nose: Nose normal. No congestion or rhinorrhea.     Mouth/Throat:     Mouth: Mucous membranes are moist.     Pharynx: Oropharynx is  clear. No oropharyngeal exudate or posterior oropharyngeal erythema.  Eyes:     General: No scleral icterus.       Right eye: No discharge.        Left eye: No discharge.     Extraocular Movements: Extraocular movements intact.     Conjunctiva/sclera: Conjunctivae normal.     Pupils: Pupils are equal, round, and reactive to light.  Neck:     Vascular: No carotid bruit.  Cardiovascular:     Rate and Rhythm: Normal rate and regular rhythm.     Pulses: Normal pulses.     Heart  sounds: Normal heart sounds. No murmur heard.    No friction rub. No gallop.  Pulmonary:     Effort: Pulmonary effort is normal. No respiratory distress.     Breath sounds: Normal breath sounds. No stridor. No wheezing, rhonchi or rales.  Chest:     Chest wall: No tenderness.     Comments: Well healed scar in the medial right breast at about 3 o'clock.  No masses in either breasts.  Abdominal:     General: Bowel sounds are normal. There is no distension.     Palpations: Abdomen is soft. There is no hepatomegaly, splenomegaly or mass.     Tenderness: There is no abdominal tenderness. There is no right CVA tenderness, left CVA tenderness, guarding or rebound.     Hernia: No hernia is present.     Comments: Scar in the right lower quadrant.    Musculoskeletal:        General: No swelling, tenderness, deformity or signs of injury. Normal range of motion.     Cervical back: Normal range of motion and neck supple. No rigidity or tenderness.     Right lower leg: Edema (trace) present.     Left lower leg: No edema.  Lymphadenopathy:     Cervical: No cervical adenopathy.  Skin:    General: Skin is warm and dry.     Coloration: Skin is not jaundiced or pale.     Findings: No bruising, erythema, lesion or rash.  Neurological:     General: No focal deficit present.     Mental Status: She is alert and oriented to person, place, and time. Mental status is at baseline.     Cranial Nerves: No cranial nerve deficit.     Sensory: No sensory deficit.     Motor: No weakness.     Coordination: Coordination normal.     Gait: Gait normal.     Deep Tendon Reflexes: Reflexes normal.  Psychiatric:        Mood and Affect: Mood normal.        Behavior: Behavior normal.        Thought Content: Thought content normal.        Judgment: Judgment normal.    LABS:      Latest Ref Rng & Units 07/08/2022   12:00 AM 05/22/2021   12:56 PM 10/05/2018    5:41 AM  CBC  WBC  8.9     10.5  16.6   Hemoglobin  12.0 - 16.0 13.5     14.9  11.5   Hematocrit 36 - 46 43     45.9  36.0   Platelets 150 - 400 K/uL 177     286  187      This result is from an external source.      Latest Ref Rng & Units 07/08/2022   12:00 AM 05/22/2021  12:56 PM 10/05/2018    5:41 AM  CMP  Glucose 70 - 99 mg/dL  898  892   BUN 4 - 21 18     18  19    Creatinine 0.5 - 1.1 0.7     0.66  0.69   Sodium 137 - 147 140     141  133   Potassium 3.5 - 5.1 mEq/L 4.5     4.0  4.1   Chloride 99 - 108 102     107  104   CO2 13 - 22 25     25  22    Calcium  8.7 - 10.7 9.8     9.5  7.9   Alkaline Phos 25 - 125 68        AST 13 - 35 19        ALT 7 - 35 U/L 21           This result is from an external source.   STUDIES:  Exam: 04/20/2024 Diagnostic Bilateral Mammogram Impression: No mammographic evidence of malignancy.   Exam: 05/14/2023 Dual X-Ray Absorptiometry (DXA) for Bone Mineral Density Impression: AP Spine L1-L4 (L2) 05/14/2023  75.7  Normal  0.3  1.202g/cm2 DualFemur Neck Right 05/14/2023  75.7  Normal  0.3  0.997g/cm2 DualFemur TotalMEan 05/14/2023  75.7  Normal  0.5  1.064g/cm2    ALLERGIES:  No known allergies.  Current Medications: Current Outpatient Medications  Medication Sig Dispense Refill   meloxicam (MOBIC) 15 MG tablet Temporary     acetaminophen  (TYLENOL ) 500 MG tablet Take 1,000 mg by mouth every 6 (six) hours as needed.     atorvastatin  (LIPITOR) 20 MG tablet Take 20 mg by mouth at bedtime.     Calcium  Carb-Cholecalciferol (CALCIUM  600 + D PO) Take 1 tablet by mouth daily.     Carboxymethylcellul-Glycerin (LUBRICATING EYE DROPS OP) Place 1 drop into both eyes daily as needed (dry/tired eyes).     Cholecalciferol (DIALYVITE VITAMIN D  5000) 125 MCG (5000 UT) capsule Take 5,000 Units by mouth daily.     denosumab  (PROLIA ) 60 MG/ML SOSY injection Inject 60 mg into the skin every 6 (six) months.     furosemide (LASIX) 20 MG tablet Take 20 mg by mouth daily.     METAMUCIL FIBER PO Take 1 Scoop by  mouth daily.     No current facility-administered medications for this visit.   ASSESSMENT & PLAN:  Assessment:   Stage 0 left breast cancer diagnosed in August 2015, now 9 years postop with no evidence of disease.    Osteopenia of the spine, now considered normal on bone density even though clinically abnormal.  She will continue oral calcium /vitamin D  and Prolia  injections.  We will continue bone density scans every 2 years.    3.  Degenerative disc disease and retropulsion causing high-grade spinal stenosis at L4-5 and L5-S1 right foraminal impingement.  Her symptoms were doing better, but now worsening again.  4.  Stage 0 right breast cancer, diagnosed in October of 2024 and measuring at least 16 mm, ER and PR positive and treated with lumpectomy. I do not recommend radiation in view of her age, small size and clear margins. We discussed hormonal therapy as chemoprevention, but felt the risk outweighed the benefit in view of her worsening bone density, so we will not pursue this, especially since she has already had a 5 year course of raloxifene  in the past.  Plan:  She had a diagnostic bilateral mammogram  done on 04/19/2024 that was clear. She has decided to switch and have her mammograms done here. She informed me that she had an appointment with her orthopedist due to increasing back pain and she was placed on Prednisone. Her PCP continues to do routine labs and she continues her Prolia  injection every 6 months. I will see her back in 1 year with screening bilateral mammogram.  She and her daughter understand and agree with this plan of care.  I provided 38 minutes of face-to-face time during this this encounter and > 50% was spent counseling as documented under my assessment and plan.   Wanda VEAR Cornish, MD  [facility] OF [facility] [address] [place] [address] Dept: [phone] Dept Fax: [phone]   No  orders of the defined types were placed in this encounter.   I,Jasmine M Lassiter,acting as a scribe for Wanda VEAR Cornish, MD.,have documented all relevant documentation on the behalf of Wanda VEAR Cornish, MD,as directed by  Wanda VEAR Cornish, MD while in the presence of Wanda VEAR Cornish, MD.

## 2024-06-11 ENCOUNTER — Encounter: Payer: Self-pay | Admitting: Oncology

## 2025-04-27 ENCOUNTER — Ambulatory Visit (HOSPITAL_BASED_OUTPATIENT_CLINIC_OR_DEPARTMENT_OTHER): Admitting: Radiology

## 2025-05-03 ENCOUNTER — Inpatient Hospital Stay: Admitting: Oncology
